# Patient Record
Sex: Female | Born: 1935 | Race: White | Hispanic: No | Marital: Single | Smoking: Former smoker
Health system: Southern US, Community
[De-identification: ages and names within clinical notes are randomized; demographics above are authoritative.]

## PROBLEM LIST (undated history)

## (undated) DIAGNOSIS — T8859XA Other complications of anesthesia, initial encounter: Secondary | ICD-10-CM

## (undated) DIAGNOSIS — R32 Unspecified urinary incontinence: Secondary | ICD-10-CM

## (undated) DIAGNOSIS — N390 Urinary tract infection, site not specified: Secondary | ICD-10-CM

## (undated) DIAGNOSIS — C4492 Squamous cell carcinoma of skin, unspecified: Secondary | ICD-10-CM

## (undated) DIAGNOSIS — C801 Malignant (primary) neoplasm, unspecified: Secondary | ICD-10-CM

## (undated) DIAGNOSIS — M1990 Unspecified osteoarthritis, unspecified site: Secondary | ICD-10-CM

## (undated) DIAGNOSIS — J189 Pneumonia, unspecified organism: Secondary | ICD-10-CM

## (undated) DIAGNOSIS — Z9221 Personal history of antineoplastic chemotherapy: Secondary | ICD-10-CM

## (undated) DIAGNOSIS — C50919 Malignant neoplasm of unspecified site of unspecified female breast: Secondary | ICD-10-CM

## (undated) DIAGNOSIS — T4145XA Adverse effect of unspecified anesthetic, initial encounter: Secondary | ICD-10-CM

## (undated) HISTORY — DX: Squamous cell carcinoma of skin, unspecified: C44.92

## (undated) HISTORY — DX: Malignant neoplasm of unspecified site of unspecified female breast: C50.919

## (undated) HISTORY — PX: MASTECTOMY: SHX3

## (undated) HISTORY — PX: ABDOMINAL HYSTERECTOMY: SHX81

## (undated) HISTORY — PX: TONSILLECTOMY: SUR1361

## (undated) HISTORY — DX: Unspecified urinary incontinence: R32

## (undated) HISTORY — PX: TRIGGER FINGER RELEASE: SHX641

## (undated) HISTORY — PX: CARPAL TUNNEL RELEASE: SHX101

## (undated) HISTORY — DX: Urinary tract infection, site not specified: N39.0

---

## 1997-08-25 ENCOUNTER — Other Ambulatory Visit: Admission: RE | Admit: 1997-08-25 | Discharge: 1997-08-25 | Payer: Self-pay | Admitting: Obstetrics and Gynecology

## 1997-11-08 ENCOUNTER — Other Ambulatory Visit: Admission: RE | Admit: 1997-11-08 | Discharge: 1997-11-08 | Payer: Self-pay | Admitting: Obstetrics and Gynecology

## 1997-11-29 ENCOUNTER — Ambulatory Visit (HOSPITAL_COMMUNITY): Admission: RE | Admit: 1997-11-29 | Discharge: 1997-11-29 | Payer: Self-pay | Admitting: Obstetrics and Gynecology

## 1998-03-14 ENCOUNTER — Other Ambulatory Visit: Admission: RE | Admit: 1998-03-14 | Discharge: 1998-03-14 | Payer: Self-pay | Admitting: Obstetrics and Gynecology

## 1998-07-21 ENCOUNTER — Other Ambulatory Visit: Admission: RE | Admit: 1998-07-21 | Discharge: 1998-07-21 | Payer: Self-pay | Admitting: Obstetrics and Gynecology

## 1998-08-30 ENCOUNTER — Other Ambulatory Visit: Admission: RE | Admit: 1998-08-30 | Discharge: 1998-08-30 | Payer: Self-pay | Admitting: Obstetrics and Gynecology

## 1999-03-07 ENCOUNTER — Other Ambulatory Visit: Admission: RE | Admit: 1999-03-07 | Discharge: 1999-03-07 | Payer: Self-pay | Admitting: Obstetrics and Gynecology

## 2000-03-06 ENCOUNTER — Other Ambulatory Visit: Admission: RE | Admit: 2000-03-06 | Discharge: 2000-03-06 | Payer: Self-pay | Admitting: Obstetrics and Gynecology

## 2001-03-20 ENCOUNTER — Other Ambulatory Visit: Admission: RE | Admit: 2001-03-20 | Discharge: 2001-03-20 | Payer: Self-pay | Admitting: Obstetrics and Gynecology

## 2002-03-30 ENCOUNTER — Other Ambulatory Visit: Admission: RE | Admit: 2002-03-30 | Discharge: 2002-03-30 | Payer: Self-pay | Admitting: Obstetrics and Gynecology

## 2002-07-01 HISTORY — PX: OTHER SURGICAL HISTORY: SHX169

## 2003-04-04 ENCOUNTER — Other Ambulatory Visit: Admission: RE | Admit: 2003-04-04 | Discharge: 2003-04-04 | Payer: Self-pay | Admitting: Obstetrics and Gynecology

## 2011-06-26 DIAGNOSIS — Z8542 Personal history of malignant neoplasm of other parts of uterus: Secondary | ICD-10-CM | POA: Insufficient documentation

## 2014-04-22 DIAGNOSIS — M67461 Ganglion, right knee: Secondary | ICD-10-CM | POA: Insufficient documentation

## 2014-04-22 DIAGNOSIS — N390 Urinary tract infection, site not specified: Secondary | ICD-10-CM | POA: Insufficient documentation

## 2014-04-22 DIAGNOSIS — G5600 Carpal tunnel syndrome, unspecified upper limb: Secondary | ICD-10-CM | POA: Insufficient documentation

## 2014-04-22 DIAGNOSIS — E785 Hyperlipidemia, unspecified: Secondary | ICD-10-CM | POA: Insufficient documentation

## 2014-04-22 DIAGNOSIS — M810 Age-related osteoporosis without current pathological fracture: Secondary | ICD-10-CM | POA: Insufficient documentation

## 2014-04-22 DIAGNOSIS — N952 Postmenopausal atrophic vaginitis: Secondary | ICD-10-CM | POA: Insufficient documentation

## 2014-04-22 DIAGNOSIS — R350 Frequency of micturition: Secondary | ICD-10-CM | POA: Insufficient documentation

## 2014-04-22 DIAGNOSIS — R300 Dysuria: Secondary | ICD-10-CM | POA: Insufficient documentation

## 2014-04-22 DIAGNOSIS — D500 Iron deficiency anemia secondary to blood loss (chronic): Secondary | ICD-10-CM | POA: Insufficient documentation

## 2014-04-22 DIAGNOSIS — M109 Gout, unspecified: Secondary | ICD-10-CM | POA: Insufficient documentation

## 2014-04-22 DIAGNOSIS — M7040 Prepatellar bursitis, unspecified knee: Secondary | ICD-10-CM | POA: Insufficient documentation

## 2014-04-22 DIAGNOSIS — E611 Iron deficiency: Secondary | ICD-10-CM | POA: Insufficient documentation

## 2014-04-22 DIAGNOSIS — E559 Vitamin D deficiency, unspecified: Secondary | ICD-10-CM | POA: Insufficient documentation

## 2014-04-22 DIAGNOSIS — R918 Other nonspecific abnormal finding of lung field: Secondary | ICD-10-CM | POA: Insufficient documentation

## 2014-11-29 DIAGNOSIS — M48061 Spinal stenosis, lumbar region without neurogenic claudication: Secondary | ICD-10-CM | POA: Insufficient documentation

## 2014-12-27 DIAGNOSIS — M5136 Other intervertebral disc degeneration, lumbar region: Secondary | ICD-10-CM | POA: Insufficient documentation

## 2014-12-27 DIAGNOSIS — M4726 Other spondylosis with radiculopathy, lumbar region: Secondary | ICD-10-CM | POA: Insufficient documentation

## 2015-04-11 DIAGNOSIS — Z09 Encounter for follow-up examination after completed treatment for conditions other than malignant neoplasm: Secondary | ICD-10-CM | POA: Insufficient documentation

## 2016-03-01 DIAGNOSIS — E785 Hyperlipidemia, unspecified: Secondary | ICD-10-CM | POA: Insufficient documentation

## 2018-05-22 DIAGNOSIS — M25551 Pain in right hip: Secondary | ICD-10-CM | POA: Insufficient documentation

## 2018-08-19 ENCOUNTER — Encounter (HOSPITAL_COMMUNITY): Payer: Self-pay | Admitting: Student

## 2018-08-19 NOTE — H&P (Signed)
TOTAL HIP ADMISSION H&P  Patient is admitted for right total hip arthroplasty.  Subjective:  Chief Complaint: right hip pain  HPI: Heather Rhodes, 85 y.o. female, has a history of pain and functional disability in the right hip(s) due to arthritis and patient has failed non-surgical conservative treatments for greater than 12 weeks to include corticosteriod injections and activity modification.  Onset of symptoms was abrupt starting 1 years ago with gradually worsening course since that time.The patient noted no past surgery on the right hip(s).  Patient currently rates pain in the right hip at 8 out of 10 with activity. Patient has worsening of pain with activity and weight bearing and instability. 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 This condition presents safety issues increasing the risk of falls. There is no current active infection.  There are no active problems to display for this patient.  History reviewed. No pertinent past medical history.  History reviewed. No pertinent surgical history.  No current facility-administered medications for this encounter.    No current outpatient medications on file.   Allergies not on file  Social History   Tobacco Use  . Smoking status: Not on file  Substance Use Topics  . Alcohol use: Not on file    History reviewed. No pertinent family history.   Review of Systems  Constitutional: Negative for chills and fever.  HENT: Negative for congestion, sore throat and tinnitus.   Eyes: Negative for double vision, photophobia and pain.  Respiratory: Negative for cough, shortness of breath and wheezing.   Cardiovascular: Negative for chest pain, palpitations and orthopnea.  Gastrointestinal: Negative for heartburn, nausea and vomiting.  Genitourinary: Negative for dysuria, frequency and urgency.  Musculoskeletal: Positive for joint pain.  Neurological: Negative for dizziness, weakness  and headaches.    Objective:  Physical Exam  Well nourished and well developed.  General: Alert and oriented x3, cooperative and pleasant, no acute distress.  Head: normocephalic, atraumatic, neck supple.  Eyes: EOMI.  Respiratory: breath sounds clear in all fields, no wheezing, rales, or rhonchi. Cardiovascular: Regular rate and rhythm, no murmurs, gallops or rubs.  Abdomen: non-tender to palpation and soft, normoactive bowel sounds. Musculoskeletal:  Right Hip Exam: ROM: Flexion to 100, Internal Rotation 0, External Rotation 10-20, and Abduction 20 with pain and discomfort. There is no tenderness over the greater trochanter bursa.   Calves soft and nontender. Motor function intact in LE. Strength 5/5 LE bilaterally. Neuro: Distal pulses 2+. Sensation to light touch intact in LE.  Vital signs in last 24 hours: Blood pressure: 122/72 mmHg Pulse: 76 bpm  Imaging Review Plain radiographs demonstrate severe degenerative joint disease of the right hip(s). The bone quality appears to be adequate for age and reported activity level.   Assessment/Plan:  End stage arthritis, right hip(s)  The patient history, physical examination, clinical judgement of the provider and imaging studies are consistent with end stage degenerative joint disease of the right hip(s) and total hip arthroplasty is deemed medically necessary. The treatment options including medical management, injection therapy, arthroscopy and arthroplasty were discussed at length. The risks and benefits of total hip arthroplasty were presented and reviewed. The risks due to aseptic loosening, infection, stiffness, dislocation/subluxation,  thromboembolic complications and other imponderables were discussed.  The patient acknowledged the explanation, agreed to proceed with the plan and consent was signed. Patient is being admitted for inpatient treatment for surgery, pain control, PT, OT, prophylactic antibiotics, VTE prophylaxis,  progressive ambulation and  ADL's and discharge planning.The patient is planning to be discharged home.  Therapy Plans: HHPT Disposition: Home to retirement community Planned DVT Prophylaxis: Xarelto 10 mg daily (hx breast and endometrial CA) DME needed: None PCP: Dagoberto Reef, MD TXA: IV Allergies: NKDA Anesthesia Concerns: None BMI: 23 Other: NO BP OR IV STICKS IN LEFT ARM  - Patient was instructed on what medications to stop prior to surgery. - Follow-up visit in 2 weeks with Dr. Wynelle Link - Begin physical therapy following surgery - Pre-operative lab work as pre-surgical testing - Prescriptions will be provided in hospital at time of discharge  Theresa Duty, PA-C Orthopedic Surgery [facility] [place]

## 2018-08-25 ENCOUNTER — Encounter (HOSPITAL_COMMUNITY): Payer: Self-pay

## 2018-08-25 NOTE — Patient Instructions (Signed)
Heather Rhodes  08/25/2018   Your procedure is scheduled on: 09-02-18   Report to [facility] Main  Entrance              Report to admitting at      0600 AM    Call this number if you have problems the morning of surgery [phone]    Remember: Do not eat food or drink liquids :After Midnight.  BRUSH YOUR TEETH MORNING OF SURGERY AND RINSE YOUR MOUTH OUT, NO CHEWING GUM CANDY OR MINTS.     Take these medicines the morning of surgery with A SIP OF WATER: None                                You may not have any metal on your body including hair pins and              piercings  Do not wear jewelry, make-up, lotions, powders or perfumes, deodorant             Do not wear nail polish.  Do not shave  48 hours prior to surgery.     Do not bring valuables to the hospital. [place].  Contacts, dentures or bridgework may not be worn into surgery.  Leave suitcase in the car. After surgery it may be brought to your room.                Please read over the following fact sheets you were given: _____________________________________________________________________             [facility] - Preparing for Surgery Before surgery, you can play an important role.  Because skin is not sterile, your skin needs to be as free of germs as possible.  You can reduce the number of germs on your skin by washing with CHG (chlorahexidine gluconate) soap before surgery.  CHG is an antiseptic cleaner which kills germs and bonds with the skin to continue killing germs even after washing. Please DO NOT use if you have an allergy to CHG or antibacterial soaps.  If your skin becomes reddened/irritated stop using the CHG and inform your nurse when you arrive at Short Stay. Do not shave (including legs and underarms) for at least 48 hours prior to the first CHG shower.  You may shave your face/neck. Please follow these instructions  carefully:  1.  Shower with CHG Soap the night before surgery and the  morning of Surgery.  2.  If you choose to wash your hair, wash your hair first as usual with your  normal  shampoo.  3.  After you shampoo, rinse your hair and body thoroughly to remove the  shampoo.                           4.  Use CHG as you would any other liquid soap.  You can apply chg directly  to the skin and wash                       Gently with a scrungie or clean washcloth.  5.  Apply the CHG Soap to your body ONLY FROM THE NECK  DOWN.   Do not use on face/ open                           Wound or open sores. Avoid contact with eyes, ears mouth and genitals (private parts).                       Wash face,  Genitals (private parts) with your normal soap.             6.  Wash thoroughly, paying special attention to the area where your surgery  will be performed.  7.  Thoroughly rinse your body with warm water from the neck down.  8.  DO NOT shower/wash with your normal soap after using and rinsing off  the CHG Soap.                9.  Pat yourself dry with a clean towel.            10.  Wear clean pajamas.            11.  Place clean sheets on your bed the night of your first shower and do not  sleep with pets. Day of Surgery : Do not apply any lotions/deodorants the morning of surgery.  Please wear clean clothes to the hospital/surgery center.  FAILURE TO FOLLOW THESE INSTRUCTIONS MAY RESULT IN THE CANCELLATION OF YOUR SURGERY PATIENT SIGNATURE_________________________________  NURSE SIGNATURE__________________________________  ________________________________________________________________________  WHAT IS A BLOOD TRANSFUSION? Blood Transfusion Information  A transfusion is the replacement of blood or some of its parts. Blood is made up of multiple cells which provide different functions.  Red blood cells carry oxygen and are used for blood loss replacement.  White blood cells fight against  infection.  Platelets control bleeding.  Plasma helps clot blood.  Other blood products are available for specialized needs, such as hemophilia or other clotting disorders. BEFORE THE TRANSFUSION  Who gives blood for transfusions?   Healthy volunteers who are fully evaluated to make sure their blood is safe. This is blood bank blood. Transfusion therapy is the safest it has ever been in the practice of medicine. Before blood is taken from a donor, a complete history is taken to make sure that person has no history of diseases nor engages in risky social behavior (examples are intravenous drug use or sexual activity with multiple partners). The donor's travel history is screened to minimize risk of transmitting infections, such as malaria. The donated blood is tested for signs of infectious diseases, such as HIV and hepatitis. The blood is then tested to be sure it is compatible with you in order to minimize the chance of a transfusion reaction. If you or a relative donates blood, this is often done in anticipation of surgery and is not appropriate for emergency situations. It takes many days to process the donated blood. RISKS AND COMPLICATIONS Although transfusion therapy is very safe and saves many lives, the main dangers of transfusion include:   Getting an infectious disease.  Developing a transfusion reaction. This is an allergic reaction to something in the blood you were given. Every precaution is taken to prevent this. The decision to have a blood transfusion has been considered carefully by your caregiver before blood is given. Blood is not given unless the benefits outweigh the risks. AFTER THE TRANSFUSION  Right after receiving a blood transfusion, you will usually feel much better and more energetic.  This is especially true if your red blood cells have gotten low (anemic). The transfusion raises the level of the red blood cells which carry oxygen, and this usually causes an energy  increase.  The nurse administering the transfusion will monitor you carefully for complications. HOME CARE INSTRUCTIONS  No special instructions are needed after a transfusion. You may find your energy is better. Speak with your caregiver about any limitations on activity for underlying diseases you may have. SEEK MEDICAL CARE IF:   Your condition is not improving after your transfusion.  You develop redness or irritation at the intravenous (IV) site. SEEK IMMEDIATE MEDICAL CARE IF:  Any of the following symptoms occur over the next 12 hours:  Shaking chills.  You have a temperature by mouth above 102 F (38.9 C), not controlled by medicine.  Chest, back, or muscle pain.  People around you feel you are not acting correctly or are confused.  Shortness of breath or difficulty breathing.  Dizziness and fainting.  You get a rash or develop hives.  You have a decrease in urine output.  Your urine turns a dark color or changes to pink, red, or brown. Any of the following symptoms occur over the next 10 days:  You have a temperature by mouth above 102 F (38.9 C), not controlled by medicine.  Shortness of breath.  Weakness after normal activity.  The white part of the eye turns yellow (jaundice).  You have a decrease in the amount of urine or are urinating less often.  Your urine turns a dark color or changes to pink, red, or brown. Document Released: 06/14/2000 Document Revised: 09/09/2011 Document Reviewed: 02/01/2008 Exit[employer] Patient Information 2014 [place].  _______________________________________________________________________  Incentive Spirometer  An incentive spirometer is a tool that can help keep your lungs clear and active. This tool measures how well you are filling your lungs with each breath. Taking long deep breaths may help reverse or decrease the chance of developing breathing (pulmonary) problems (especially infection) following:  A long  period of time when you are unable to move or be active. BEFORE THE PROCEDURE   If the spirometer includes an indicator to show your best effort, your nurse or respiratory therapist will set it to a desired goal.  If possible, sit up straight or lean slightly forward. Try not to slouch.  Hold the incentive spirometer in an upright position. INSTRUCTIONS FOR USE  1. Sit on the edge of your bed if possible, or sit up as far as you can in bed or on a chair. 2. Hold the incentive spirometer in an upright position. 3. Breathe out normally. 4. Place the mouthpiece in your mouth and seal your lips tightly around it. 5. Breathe in slowly and as deeply as possible, raising the piston or the ball toward the top of the column. 6. Hold your breath for 3-5 seconds or for as long as possible. Allow the piston or ball to fall to the bottom of the column. 7. Remove the mouthpiece from your mouth and breathe out normally. 8. Rest for a few seconds and repeat Steps 1 through 7 at least 10 times every 1-2 hours when you are awake. Take your time and take a few normal breaths between deep breaths. 9. The spirometer may include an indicator to show your best effort. Use the indicator as a goal to work toward during each repetition. 10. After each set of 10 deep breaths, practice coughing to be sure your lungs are clear.  If you have an incision (the cut made at the time of surgery), support your incision when coughing by placing a pillow or rolled up towels firmly against it. Once you are able to get out of bed, walk around indoors and cough well. You may stop using the incentive spirometer when instructed by your caregiver.  RISKS AND COMPLICATIONS  Take your time so you do not get dizzy or light-headed.  If you are in pain, you may need to take or ask for pain medication before doing incentive spirometry. It is harder to take a deep breath if you are having pain. AFTER USE  Rest and breathe slowly and  easily.  It can be helpful to keep track of a log of your progress. Your caregiver can provide you with a simple table to help with this. If you are using the spirometer at home, follow these instructions: [place] IF:   You are having difficultly using the spirometer.  You have trouble using the spirometer as often as instructed.  Your pain medication is not giving enough relief while using the spirometer.  You develop fever of 100.5 F (38.1 C) or higher. SEEK IMMEDIATE MEDICAL CARE IF:   You cough up bloody sputum that had not been present before.  You develop fever of 102 F (38.9 C) or greater.  You develop worsening pain at or near the incision site. MAKE SURE YOU:   Understand these instructions.  Will watch your condition.  Will get help right away if you are not doing well or get worse. Document Released: 10/28/2006 Document Revised: 09/09/2011 Document Reviewed: 12/29/2006 [facility] Patient Information 2014 [place], [place].   ________________________________________________________________________

## 2018-08-26 ENCOUNTER — Encounter (HOSPITAL_COMMUNITY): Payer: Self-pay | Admitting: Physician Assistant

## 2018-08-26 ENCOUNTER — Encounter (HOSPITAL_COMMUNITY)
Admission: RE | Admit: 2018-08-26 | Discharge: 2018-08-26 | Disposition: A | Discharge status: Home / Self Care | Payer: Medicare HMO | Source: Ambulatory Visit | Attending: Orthopedic Surgery | Admitting: Orthopedic Surgery

## 2018-08-26 ENCOUNTER — Other Ambulatory Visit: Payer: Self-pay

## 2018-08-26 ENCOUNTER — Encounter (HOSPITAL_COMMUNITY): Payer: Self-pay

## 2018-08-26 DIAGNOSIS — Z01812 Encounter for preprocedural laboratory examination: Secondary | ICD-10-CM | POA: Insufficient documentation

## 2018-08-26 HISTORY — DX: Pneumonia, unspecified organism: J18.9

## 2018-08-26 HISTORY — DX: Malignant (primary) neoplasm, unspecified: C80.1

## 2018-08-26 HISTORY — DX: Adverse effect of unspecified anesthetic, initial encounter: T41.45XA

## 2018-08-26 HISTORY — DX: Unspecified osteoarthritis, unspecified site: M19.90

## 2018-08-26 HISTORY — DX: Personal history of antineoplastic chemotherapy: Z92.21

## 2018-08-26 HISTORY — DX: Other complications of anesthesia, initial encounter: T88.59XA

## 2018-08-26 LAB — CBC
HCT: 42.5 % (ref 36.0–46.0)
Hemoglobin: 13.3 g/dL (ref 12.0–15.0)
MCH: 31.3 pg (ref 26.0–34.0)
MCHC: 31.3 g/dL (ref 30.0–36.0)
MCV: 100 fL (ref 80.0–100.0)
NRBC: 0 % (ref 0.0–0.2)
Platelets: 291 10*3/uL (ref 150–400)
RBC: 4.25 MIL/uL (ref 3.87–5.11)
RDW: 12.2 % (ref 11.5–15.5)
WBC: 8 10*3/uL (ref 4.0–10.5)

## 2018-08-26 LAB — COMPREHENSIVE METABOLIC PANEL
ALT: 18 U/L (ref 0–44)
AST: 23 U/L (ref 15–41)
Albumin: 4.2 g/dL (ref 3.5–5.0)
Alkaline Phosphatase: 62 U/L (ref 38–126)
Anion gap: 8 (ref 5–15)
BUN: 24 mg/dL — ABNORMAL HIGH (ref 8–23)
CO2: 25 mmol/L (ref 22–32)
Calcium: 9.4 mg/dL (ref 8.9–10.3)
Chloride: 106 mmol/L (ref 98–111)
Creatinine, Ser: 0.72 mg/dL (ref 0.44–1.00)
GFR calc Af Amer: >60 mL/min (ref >60)
GFR calc non Af Amer: >60 mL/min (ref >60)
GLUCOSE: 96 mg/dL (ref 70–99)
Potassium: 4.1 mmol/L (ref 3.5–5.1)
Sodium: 139 mmol/L (ref 135–145)
Total Bilirubin: 0.6 mg/dL (ref 0.3–1.2)
Total Protein: 7.1 g/dL (ref 6.5–8.1)

## 2018-08-26 LAB — ABO/RH: ABO/RH(D): A POS

## 2018-08-26 LAB — PROTIME-INR
INR: 0.9 (ref 0.8–1.2)
Prothrombin Time: 12 seconds (ref 11.4–15.2)

## 2018-08-26 LAB — SURGICAL PCR SCREEN
MRSA, PCR: NEGATIVE
Staphylococcus aureus: NEGATIVE

## 2018-08-26 LAB — APTT: aPTT: 38 seconds — ABNORMAL HIGH (ref 24–36)

## 2018-08-27 NOTE — Progress Notes (Signed)
PCP:  Adline Mango  CARDIOLOGIST: None  INFO IN Epic:bun [id number], ptt [id number]  INFO ON CHART: EKG on chart  BLOOD THINNERS AND LAST DOSES:n/a ____________________________________  PATIENT SYMPTOMS AT TIME OF PREOP: none

## 2018-08-27 NOTE — Progress Notes (Signed)
ekg 08-07-18 on chart

## 2018-09-02 LAB — TYPE AND SCREEN
ABO/RH(D): A POS
Antibody Screen: NEGATIVE

## 2018-10-14 ENCOUNTER — Encounter (HOSPITAL_COMMUNITY): Admission: RE | Payer: Self-pay | Source: Home / Self Care

## 2018-10-14 ENCOUNTER — Inpatient Hospital Stay (HOSPITAL_COMMUNITY): Admission: RE | Admit: 2018-10-14 | Payer: Medicare HMO | Source: Home / Self Care | Admitting: Orthopedic Surgery

## 2018-10-14 SURGERY — ARTHROPLASTY, HIP, TOTAL, ANTERIOR APPROACH
Anesthesia: Choice | Laterality: Right

## 2018-12-02 NOTE — Patient Instructions (Addendum)
Heather Rhodes [facility]   Your procedure is scheduled on: Wednesday 12/09/2018    Report to [facility] Main  Entrance Report to admitting at  1100  AM                 Heather Rhodes 21 TEST ON_Monday 6/8______ @__11 :05_____, THIS TEST MUST BE DONE BEFORE SURGERY, COME TO [name].    Call this number if you have problems the morning of surgery [phone]     Remember: Do not eat food  :After Midnight.              NO SOLID FOOD AFTER MIDNIGHT THE NIGHT PRIOR TO SURGERY           . NOTHING BY MOUTH EXCEPT CLEAR LIQUIDS UNTIL  0730 am.               PLEASE FINISH ENSURE DRINK PER SURGEON ORDER  WHICH NEEDS TO BE COMPLETED AT   0430 am.   CLEAR LIQUID DIET   Foods Allowed                                                                     Foods Excluded  Coffee and tea, regular and decaf                             liquids that you cannot  Plain Jell-O in any flavor                                             see through such as: Fruit ices (not with fruit pulp)                                     milk, soups, orange juice  Iced Popsicles                                    All solid food Carbonated beverages, regular and diet                                    Cranberry, grape and apple juices Sports drinks like Gatorade Lightly seasoned clear broth or consume(fat free) Sugar, honey syrup                                      BRUSH YOUR TEETH MORNING OF SURGERY AND RINSE YOUR MOUTH OUT           , NO CHEWING GUM CANDY OR MINTS.     Take these medicines the morning of surgery with A SIP OF WATER: none  You may not have any metal on your body including hair pins and              piercings               Do not wear jewelry, make-up, lotions, powders or perfumes, deodorant             Do not wear nail polish.               Do not shave  48 hours prior to surgery.             Do not bring valuables to the hospital. Heather Rhodes.  Contacts, dentures or bridgework may not be worn into surgery.                   Please read over the following fact sheets you were given: _____________________________________________________________________             Heather Rhodes - Preparing for Surgery  Before surgery, you can play an important role.  Because skin is not sterile, your skin needs to be as free of germs as possible.   You can reduce the number of germs on your skin by washing with CHG (chlorahexidine gluconate) soap before surgery.   CHG is an antiseptic cleaner which kills germs and bonds with the skin to continue killing germs even after washing. Please DO NOT use if you have an allergy to CHG or antibacterial soaps.  If your skin becomes reddened/irritated stop using the CHG and inform your nurse when you arrive at Short Stay. Do not shave (including legs and underarms) for at least 48 hours prior to the first CHG shower.  You may shave your face/neck.  Please follow these instructions carefully :  1.  Shower with CHG Soap the night before surgery and the  morning of Surgery.  2.  If you choose to wash your hair, wash your hair first as usual with your  normal  shampoo.  3.  After you shampoo, rinse your hair and body thoroughly to remove the  shampoo.                                        4.  Use CHG as you would any other liquid soap.  You can apply chg directly  to the skin and wash                       Gently with a scrungie or clean washcloth.  5.  Apply the CHG Soap to your body ONLY FROM THE NECK DOWN.   Do not use on face/ open                           Wound or open sores. Avoid contact with eyes, ears mouth and genitals (private parts).                       Wash face,  Genitals (private parts) with your normal soap.             6.  Wash thoroughly, paying special attention to the area where your  surgery  will be performed.  7.  Thoroughly rinse  your body with warm water from the neck down.  8.  DO NOT shower/wash with your normal soap after using and rinsing off  the CHG Soap.             9.  Pat yourself dry with a clean towel.            10.  Wear clean pajamas.            11.  Place clean sheets on your bed the night of your first shower and do not  sleep with pets . Day of Surgery : Do not apply any lotions/deodorants the morning of surgery.  Please wear clean clothes to the hospital/surgery center.  FAILURE TO FOLLOW THESE INSTRUCTIONS MAY RESULT IN THE CANCELLATION OF YOUR SURGERY PATIENT SIGNATURE_________________________________  NURSE SIGNATURE__________________________________  ________________________________________________________________________   Heather Rhodes  An incentive spirometer is a tool that can help keep your lungs clear and active. This tool measures how well you are filling your lungs with each breath. Taking long deep breaths may help reverse or decrease the chance of developing breathing (pulmonary) problems (especially infection) following:  A long period of time when you are unable to move or be active. BEFORE THE PROCEDURE   If the spirometer includes an indicator to show your best effort, your nurse or respiratory therapist will set it to a desired goal.  If possible, sit up straight or lean slightly forward. Try not to slouch.  Hold the incentive spirometer in an upright position. INSTRUCTIONS FOR USE  1. Sit on the edge of your bed if possible, or sit up as far as you can in bed or on a chair. 2. Hold the incentive spirometer in an upright position. 3. Breathe out normally. 4. Place the mouthpiece in your mouth and seal your lips tightly around it. 5. Breathe in slowly and as deeply as possible, raising the piston or the ball toward the top of the column. 6. Hold your breath for 3-5 seconds or for as long as possible. Allow the  piston or ball to fall to the bottom of the column. 7. Remove the mouthpiece from your mouth and breathe out normally. 8. Rest for a few seconds and repeat Steps 1 through 7 at least 10 times every 1-2 hours when you are awake. Take your time and take a few normal breaths between deep breaths. 9. The spirometer may include an indicator to show your best effort. Use the indicator as a goal to work toward during each repetition. 10. After each set of 10 deep breaths, practice coughing to be sure your lungs are clear. If you have an incision (the cut made at the time of surgery), support your incision when coughing by placing a pillow or rolled up towels firmly against it. Once you are able to get out of bed, walk around indoors and cough well. You may stop using the incentive spirometer when instructed by your caregiver.  RISKS AND COMPLICATIONS  Take your time so you do not get dizzy or light-headed.  If you are in pain, you may need to take or ask for pain medication before doing incentive spirometry. It is harder to take a deep breath if you are having pain. AFTER USE  Rest and breathe slowly and easily.  It can be helpful to keep track of a log of your progress. Your caregiver can provide you with a simple table to help with this. If you are using the spirometer at home, follow these  instructions: SEEK MEDICAL CARE IF:   You are having difficultly using the spirometer.  You have trouble using the spirometer as often as instructed.  Your pain medication is not giving enough relief while using the spirometer.  You develop fever of 100.5 F (38.1 C) or higher. SEEK IMMEDIATE MEDICAL CARE IF:   You cough up bloody sputum that had not been present before.  You develop fever of 102 F (38.9 C) or greater.  You develop worsening pain at or near the incision site. MAKE SURE YOU:   Understand these instructions.  Will watch your condition.  Will get help right away if you are not  doing well or get worse. Document Released: 10/28/2006 Document Revised: 09/09/2011 Document Reviewed: 12/29/2006 ExitCare Patient Information 2014 Exit[employer], [place].   ________________________________________________________________________  WHAT IS A BLOOD TRANSFUSION? Blood Transfusion Information  A transfusion is the replacement of blood or some of its parts. Blood is made up of multiple cells which provide different functions.  Red blood cells carry oxygen and are used for blood loss replacement.  White blood cells fight against infection.  Platelets control bleeding.  Plasma helps clot blood.  Other blood products are available for specialized needs, such as hemophilia or other clotting disorders. BEFORE THE TRANSFUSION  Who gives blood for transfusions?   Healthy volunteers who are fully evaluated to make sure their blood is safe. This is blood bank blood. Transfusion therapy is the safest it has ever been in the practice of medicine. Before blood is taken from a donor, a complete history is taken to make sure that person has no history of diseases nor engages in risky social behavior (examples are intravenous drug use or sexual activity with multiple partners). The donor's travel history is screened to minimize risk of transmitting infections, such as malaria. The donated blood is tested for signs of infectious diseases, such as HIV and hepatitis. The blood is then tested to be sure it is compatible with you in order to minimize the chance of a transfusion reaction. If you or a relative donates blood, this is often done in anticipation of surgery and is not appropriate for emergency situations. It takes many days to process the donated blood. RISKS AND COMPLICATIONS Although transfusion therapy is very safe and saves many lives, the main dangers of transfusion include:   Getting an infectious disease.  Developing a transfusion reaction. This is an allergic reaction to something  in the blood you were given. Every precaution is taken to prevent this. The decision to have a blood transfusion has been considered carefully by your caregiver before blood is given. Blood is not given unless the benefits outweigh the risks. AFTER THE TRANSFUSION  Right after receiving a blood transfusion, you will usually feel much better and more energetic. This is especially true if your red blood cells have gotten low (anemic). The transfusion raises the level of the red blood cells which carry oxygen, and this usually causes an energy increase.  The nurse administering the transfusion will monitor you carefully for complications. HOME CARE INSTRUCTIONS  No special instructions are needed after a transfusion. You may find your energy is better. Speak with your caregiver about any limitations on activity for underlying diseases you may have. SEEK MEDICAL CARE IF:   Your condition is not improving after your transfusion.  You develop redness or irritation at the intravenous (IV) site. SEEK IMMEDIATE MEDICAL CARE IF:  Any of the following symptoms occur over the next 12 hours:  Shaking chills.  You have a temperature by mouth above 102 F (38.9 C), not controlled by medicine.  Chest, back, or muscle pain.  People around you feel you are not acting correctly or are confused.  Shortness of breath or difficulty breathing.  Dizziness and fainting.  You get a rash or develop hives.  You have a decrease in urine output.  Your urine turns a dark color or changes to pink, red, or brown. Any of the following symptoms occur over the next 10 days:  You have a temperature by mouth above 102 F (38.9 C), not controlled by medicine.  Shortness of breath.  Weakness after normal activity.  The white part of the eye turns yellow (jaundice).  You have a decrease in the amount of urine or are urinating less often.  Your urine turns a dark color or changes to pink, red, or  brown. Document Released: 06/14/2000 Document Revised: 09/09/2011 Document Reviewed: 02/01/2008 [facility] Patient Information 2014 [place], [place].  _______________________________________________________________________

## 2018-12-02 NOTE — Progress Notes (Signed)
08/07/2018-noted in Epic-Clearance by Dr. Fay Records

## 2018-12-03 ENCOUNTER — Encounter (HOSPITAL_COMMUNITY)
Admission: RE | Admit: 2018-12-03 | Discharge: 2018-12-03 | Disposition: A | Discharge status: Home / Self Care | Payer: Medicare HMO | Source: Ambulatory Visit | Attending: Orthopedic Surgery | Admitting: Orthopedic Surgery

## 2018-12-03 ENCOUNTER — Encounter (HOSPITAL_COMMUNITY): Payer: Self-pay

## 2018-12-03 ENCOUNTER — Other Ambulatory Visit: Payer: Self-pay

## 2018-12-03 DIAGNOSIS — Z01818 Encounter for other preprocedural examination: Secondary | ICD-10-CM | POA: Insufficient documentation

## 2018-12-03 LAB — COMPREHENSIVE METABOLIC PANEL
ALT: 18 U/L (ref 0–44)
AST: 23 U/L (ref 15–41)
Albumin: 4.3 g/dL (ref 3.5–5.0)
Alkaline Phosphatase: 63 U/L (ref 38–126)
Anion gap: 6 (ref 5–15)
BUN: 26 mg/dL — ABNORMAL HIGH (ref 8–23)
CO2: 28 mmol/L (ref 22–32)
Calcium: 9.5 mg/dL (ref 8.9–10.3)
Chloride: 106 mmol/L (ref 98–111)
Creatinine, Ser: 0.69 mg/dL (ref 0.44–1.00)
GFR calc Af Amer: >60 mL/min (ref >60)
GFR calc non Af Amer: >60 mL/min (ref >60)
Glucose, Bld: 112 mg/dL — ABNORMAL HIGH (ref 70–99)
Potassium: 4 mmol/L (ref 3.5–5.1)
Sodium: 140 mmol/L (ref 135–145)
Total Bilirubin: 0.5 mg/dL (ref 0.3–1.2)
Total Protein: 7.4 g/dL (ref 6.5–8.1)

## 2018-12-03 LAB — CBC
HCT: 41 % (ref 36.0–46.0)
Hemoglobin: 13.2 g/dL (ref 12.0–15.0)
MCH: 31.7 pg (ref 26.0–34.0)
MCHC: 32.2 g/dL (ref 30.0–36.0)
MCV: 98.3 fL (ref 80.0–100.0)
Platelets: 290 10*3/uL (ref 150–400)
RBC: 4.17 MIL/uL (ref 3.87–5.11)
RDW: 12.6 % (ref 11.5–15.5)
WBC: 7.9 10*3/uL (ref 4.0–10.5)
nRBC: 0 % (ref 0.0–0.2)

## 2018-12-03 LAB — PROTIME-INR
INR: 0.9 (ref 0.8–1.2)
Prothrombin Time: 12.1 seconds (ref 11.4–15.2)

## 2018-12-03 LAB — SURGICAL PCR SCREEN
MRSA, PCR: NEGATIVE
Staphylococcus aureus: NEGATIVE

## 2018-12-03 LAB — APTT: aPTT: 38 seconds — ABNORMAL HIGH (ref 24–36)

## 2018-12-04 NOTE — H&P (Signed)
TOTAL HIP ADMISSION H&P  Patient is admitted for right total hip arthroplasty.  Subjective:  Chief Complaint: right hip pain  HPI: Heather Rhodes, 85 y.o. female, has a history of pain and functional disability in the right hip(s) due to arthritis and patient has failed non-surgical conservative treatments for greater than 12 weeks to include NSAID's and/or analgesics and corticosteriod injections.  Onset of symptoms was gradual starting 2 years ago with gradually worsening course since that time.The patient noted no past surgery on the right hip(s).  Patient currently rates pain in the right hip at 8 out of 10 with activity. Patient has worsening of pain with activity and weight bearing, pain that interfers with activities of daily living and instability. Patient has evidence of severe, bone-on-bone end-stage right hip arthritis with slight protrusio deformity and osteophyte formation by imaging studies. This condition presents safety issues increasing the risk of falls. There is no current active infection.  There are no active problems to display for this patient.  Past Medical History:  Diagnosis Date   Arthritis    Cancer [facility]    breast left and endometrial cancer   Complication of anesthesia    during hysterectomy 2012 had to wake up took 3 hours and BP was low   Personal history of chemotherapy    with endometrial cancer   Pneumonia     Past Surgical History:  Procedure Laterality Date   ABDOMINAL HYSTERECTOMY     CARPAL TUNNEL RELEASE     MASTECTOMY     left  2006   TRIGGER FINGER RELEASE      No current facility-administered medications for this encounter.    Current Outpatient Medications  Medication Sig Dispense Refill Last Dose   ALFALFA PO Take 10 tablets by mouth daily.       Homeopathic Products (ARNICARE) GEL Apply 1 application topically daily as needed (pain).      ibuprofen (ADVIL,MOTRIN) 200 MG tablet Take 400 mg by mouth every 6 (six) hours as  needed for moderate pain.      OVER THE COUNTER MEDICATION Take 6 tablets by mouth daily. Vitamin 6 pack      Polyethyl Glycol-Propyl Glycol (SYSTANE OP) Place 1 drop into both eyes 3 (three) times daily.      No Known Allergies  Social History   Tobacco Use   Smoking status: Former Smoker    Years: 30.00    Types: Cigarettes   Smokeless tobacco: Never Used   Tobacco comment: quit 1968  social smoker  Substance Use Topics   Alcohol use: Yes    Comment: rarely    No family history on file.   ROS Constitutional: Constitutional: no fever, chills, night sweats, or significant weight loss. Cardiovascular: Cardiovascular: no palpitations or chest pain. Respiratory: Respiratory: no cough or shortness of breath and No COPD. Gastrointestinal: Gastrointestinal: no vomiting or nausea. Musculoskeletal: Musculoskeletal: no swelling in Joints and Joint Pain. Neurologic: Neurologic: no numbness, tingling, or difficulty with balance.  Objective:  Physical Exam Patient is an 85 year old female.  Well nourished and well developed. General: Alert and oriented x3, cooperative and pleasant, no acute distress. Head: normocephalic, atraumatic, neck supple. Eyes: EOMI. Respiratory: breath sounds clear in all fields, no wheezing, rales, or rhonchi. Cardiovascular: Regular rate and rhythm, no murmurs, gallops or rubs. Abdomen: non-tender to palpation and soft, normoactive bowel sounds.  Musculoskeletal: Right Hip Exam: ROM: Flexion to 100, Internal Rotation 0, External Rotation 10-20, and Abduction 20 with pain and discomfort. There  is no tenderness over the greater trochanter bursa.  Calves soft and nontender. Motor function intact in LE. Strength 5/5 LE bilaterally. Neuro: Distal pulses 2+. Sensation to light touch intact in LE.  Vital signs in last 24 hours:  BP: 110/65 mmHg  Labs:   Estimated body mass index is 22.65 kg/m as calculated from the following:   Height as of 12/03/18:  5' (1.524 m).   Weight as of 12/03/18: 52.6 kg.   Imaging Review Plain radiographs demonstrate severe degenerative joint disease of the right hip(s). The bone quality appears to be adequate for age and reported activity level.      Assessment/Plan:  End stage arthritis, right hip(s)  The patient history, physical examination, clinical judgement of the provider and imaging studies are consistent with end stage degenerative joint disease of the right hip(s) and total hip arthroplasty is deemed medically necessary. The treatment options including medical management, injection therapy, arthroscopy and arthroplasty were discussed at length. The risks and benefits of total hip arthroplasty were presented and reviewed. The risks due to aseptic loosening, infection, stiffness, dislocation/subluxation,  thromboembolic complications and other imponderables were discussed.  The patient acknowledged the explanation, agreed to proceed with the plan and consent was signed. Patient is being admitted for inpatient treatment for surgery, pain control, PT, OT, prophylactic antibiotics, VTE prophylaxis, progressive ambulation and ADL's and discharge planning.The patient is planning to be discharged home   Therapy Plans: HHPT Disposition: Home to retirement community Planned DVT Prophylaxis: Xarelto 10 mg daily (hx breast and endometrial CA) DME needed: None PCP: Dagoberto Reef, MD TXA: IV Allergies: Aleve - wheezing Anesthesia Concerns: Low blood pressure concerns with anesthesia BMI: 23 Other: NO BP OR IV STICKS IN LEFT ARM  - Patient was instructed on what medications to stop prior to surgery. - Follow-up visit in 2 weeks with Dr. Wynelle Link - Begin physical therapy following surgery - Pre-operative lab work as pre-surgical testing - Prescriptions will be provided in hospital at time of discharge  Griffith Citron, PA-C Orthopedic Surgery [facility] [place]

## 2018-12-07 ENCOUNTER — Other Ambulatory Visit: Payer: Self-pay

## 2018-12-07 ENCOUNTER — Other Ambulatory Visit (HOSPITAL_COMMUNITY)
Admission: RE | Admit: 2018-12-07 | Discharge: 2018-12-07 | Disposition: A | Discharge status: Home / Self Care | Payer: Medicare HMO | Source: Ambulatory Visit | Attending: Orthopedic Surgery | Admitting: Orthopedic Surgery

## 2018-12-07 DIAGNOSIS — Z1159 Encounter for screening for other viral diseases: Secondary | ICD-10-CM | POA: Insufficient documentation

## 2018-12-08 LAB — NOVEL CORONAVIRUS, NAA (HOSP ORDER, SEND-OUT TO REF LAB; TAT 18-24 HRS): SARS-CoV-2, NAA: NOT DETECTED

## 2018-12-08 NOTE — Progress Notes (Signed)
SPOKE W/  Voicemail     SCREENING SYMPTOMS OF COVID 19:   COUGH--  RUNNY NOSE---   SORE THROAT---  NASAL CONGESTION----  SNEEZING----  SHORTNESS OF BREATH---  DIFFICULTY BREATHING---  TEMP >100.0 -----  UNEXPLAINED BODY ACHES------  CHILLS --------   HEADACHES ---------  LOSS OF SMELL/ TASTE --------    HAVE YOU OR ANY FAMILY MEMBER TRAVELLED PAST 14 DAYS OUT OF THE   COUNTY--- STATE---- COUNTRY----  HAVE YOU OR ANY FAMILY MEMBER BEEN EXPOSED TO ANYONE WITH COVID 19?

## 2018-12-08 NOTE — Progress Notes (Signed)
SPOKE W/  _Ruth Hook     SCREENING SYMPTOMS OF COVID 19:   COUGH--NO  RUNNY NOSE---NO  SORE THROAT---NO  NASAL CONGESTION----NO  SNEEZING----NO  SHORTNESS OF BREATH---NO  DIFFICULTY BREATHING---NO  TEMP >100.0 -----NO  UNEXPLAINED BODY ACHES------NO  CHILLS --------NO  HEADACHES ---------NO  LOSS OF SMELL/ TASTE --------NO    HAVE YOU OR ANY FAMILY MEMBER TRAVELLED PAST 14 DAYS OUT OF THE   COUNTY---NO STATE----NO COUNTRY----NO  HAVE YOU OR ANY FAMILY MEMBER BEEN EXPOSED TO ANYONE WITH COVID 19? NO

## 2018-12-09 ENCOUNTER — Other Ambulatory Visit: Payer: Self-pay

## 2018-12-09 ENCOUNTER — Encounter (HOSPITAL_COMMUNITY): Payer: Self-pay | Admitting: Registered Nurse

## 2018-12-09 ENCOUNTER — Encounter (HOSPITAL_COMMUNITY)
Admission: RE | Disposition: A | Discharge status: Home Health | Payer: Self-pay | Source: Home / Self Care | Attending: Orthopedic Surgery

## 2018-12-09 ENCOUNTER — Inpatient Hospital Stay (HOSPITAL_COMMUNITY)
Admission: RE | Admit: 2018-12-09 | Discharge: 2018-12-10 | DRG: 470 | Disposition: A | Discharge status: Home Health | Payer: Medicare HMO | Attending: Orthopedic Surgery | Admitting: Orthopedic Surgery

## 2018-12-09 ENCOUNTER — Inpatient Hospital Stay (HOSPITAL_COMMUNITY): Payer: Medicare HMO

## 2018-12-09 ENCOUNTER — Inpatient Hospital Stay (HOSPITAL_COMMUNITY): Payer: Medicare HMO | Admitting: Anesthesiology

## 2018-12-09 ENCOUNTER — Inpatient Hospital Stay (HOSPITAL_COMMUNITY): Payer: Medicare HMO | Admitting: Physician Assistant

## 2018-12-09 DIAGNOSIS — Z90710 Acquired absence of both cervix and uterus: Secondary | ICD-10-CM | POA: Diagnosis not present

## 2018-12-09 DIAGNOSIS — Z853 Personal history of malignant neoplasm of breast: Secondary | ICD-10-CM | POA: Diagnosis not present

## 2018-12-09 DIAGNOSIS — Z96649 Presence of unspecified artificial hip joint: Secondary | ICD-10-CM

## 2018-12-09 DIAGNOSIS — M25751 Osteophyte, right hip: Secondary | ICD-10-CM | POA: Diagnosis present

## 2018-12-09 DIAGNOSIS — Z1159 Encounter for screening for other viral diseases: Secondary | ICD-10-CM

## 2018-12-09 DIAGNOSIS — M1611 Unilateral primary osteoarthritis, right hip: Secondary | ICD-10-CM | POA: Diagnosis present

## 2018-12-09 DIAGNOSIS — Z79899 Other long term (current) drug therapy: Secondary | ICD-10-CM

## 2018-12-09 DIAGNOSIS — M169 Osteoarthritis of hip, unspecified: Secondary | ICD-10-CM

## 2018-12-09 DIAGNOSIS — Z9012 Acquired absence of left breast and nipple: Secondary | ICD-10-CM | POA: Diagnosis not present

## 2018-12-09 DIAGNOSIS — Z87891 Personal history of nicotine dependence: Secondary | ICD-10-CM

## 2018-12-09 DIAGNOSIS — M25551 Pain in right hip: Secondary | ICD-10-CM

## 2018-12-09 DIAGNOSIS — Z8542 Personal history of malignant neoplasm of other parts of uterus: Secondary | ICD-10-CM

## 2018-12-09 DIAGNOSIS — Z9221 Personal history of antineoplastic chemotherapy: Secondary | ICD-10-CM

## 2018-12-09 HISTORY — PX: TOTAL HIP ARTHROPLASTY: SHX124

## 2018-12-09 LAB — TYPE AND SCREEN
ABO/RH(D): A POS
Antibody Screen: NEGATIVE

## 2018-12-09 SURGERY — ARTHROPLASTY, HIP, TOTAL, ANTERIOR APPROACH
Anesthesia: Spinal | Site: Hip | Laterality: Right

## 2018-12-09 MED ORDER — POVIDONE-IODINE 10 % EX SWAB: 2.0000 "application " | Freq: Once | CUTANEOUS | Status: DC

## 2018-12-09 MED ORDER — HYDROMORPHONE HCL 1 MG/ML IJ SOLN: 0.2500 mg | INTRAMUSCULAR | Status: DC | PRN

## 2018-12-09 MED ORDER — HYDROCODONE-ACETAMINOPHEN 5-325 MG PO TABS
1.0000 | ORAL_TABLET | ORAL | Status: DC | PRN
  Administered 2018-12-09 – 2018-12-10 (×4): 1 via ORAL
  Filled 2018-12-09 (×3): qty 1
  Filled 2018-12-09: qty 2

## 2018-12-09 MED ORDER — METOCLOPRAMIDE HCL 5 MG/ML IJ SOLN: 5.0000 mg | Freq: Three times a day (TID) | INTRAMUSCULAR | Status: DC | PRN

## 2018-12-09 MED ORDER — SODIUM CHLORIDE 0.9 % IV SOLN
INTRAVENOUS | Status: DC
  Administered 2018-12-09: 18:00:00 via INTRAVENOUS

## 2018-12-09 MED ORDER — ONDANSETRON HCL 4 MG/2ML IJ SOLN
INTRAMUSCULAR | Status: AC
  Filled 2018-12-09: qty 2

## 2018-12-09 MED ORDER — ACETAMINOPHEN 10 MG/ML IV SOLN
1000.0000 mg | Freq: Once | INTRAVENOUS | Status: AC
  Filled 2018-12-09: qty 100

## 2018-12-09 MED ORDER — PROPOFOL 500 MG/50ML IV EMUL
INTRAVENOUS | Status: DC | PRN
  Administered 2018-12-09: 25 ug/kg/min via INTRAVENOUS

## 2018-12-09 MED ORDER — FENTANYL CITRATE (PF) 100 MCG/2ML IJ SOLN
INTRAMUSCULAR | Status: DC | PRN
  Administered 2018-12-09 (×2): 50 ug via INTRAVENOUS

## 2018-12-09 MED ORDER — POLYETHYLENE GLYCOL 3350 17 G PO PACK: 17.0000 g | PACK | Freq: Every day | ORAL | Status: DC | PRN

## 2018-12-09 MED ORDER — 0.9 % SODIUM CHLORIDE (POUR BTL) OPTIME
TOPICAL | Status: DC | PRN
  Administered 2018-12-09: 13:00:00 1000 mL

## 2018-12-09 MED ORDER — FENTANYL CITRATE (PF) 100 MCG/2ML IJ SOLN
INTRAMUSCULAR | Status: AC
  Filled 2018-12-09: qty 2

## 2018-12-09 MED ORDER — METHOCARBAMOL 500 MG IVPB - SIMPLE MED
500.0000 mg | Freq: Four times a day (QID) | INTRAVENOUS | Status: DC | PRN
  Filled 2018-12-09: qty 50

## 2018-12-09 MED ORDER — BUPIVACAINE-EPINEPHRINE (PF) 0.25% -1:200000 IJ SOLN
INTRAMUSCULAR | Status: AC
  Filled 2018-12-09: qty 30

## 2018-12-09 MED ORDER — FLEET ENEMA 7-19 GM/118ML RE ENEM: 1.0000 | ENEMA | Freq: Once | RECTAL | Status: DC | PRN

## 2018-12-09 MED ORDER — PHENOL 1.4 % MT LIQD
1.0000 | OROMUCOSAL | Status: DC | PRN
  Filled 2018-12-09: qty 177

## 2018-12-09 MED ORDER — DOCUSATE SODIUM 100 MG PO CAPS
100.0000 mg | ORAL_CAPSULE | Freq: Two times a day (BID) | ORAL | Status: DC
  Administered 2018-12-09 – 2018-12-10 (×2): 100 mg via ORAL
  Filled 2018-12-09 (×2): qty 1

## 2018-12-09 MED ORDER — DEXAMETHASONE SODIUM PHOSPHATE 10 MG/ML IJ SOLN
INTRAMUSCULAR | Status: DC | PRN
  Administered 2018-12-09: 5 mg via INTRAVENOUS

## 2018-12-09 MED ORDER — TRANEXAMIC ACID-NACL 1000-0.7 MG/100ML-% IV SOLN
1000.0000 mg | INTRAVENOUS | Status: AC
  Administered 2018-12-09: 13:00:00 1000 mg via INTRAVENOUS
  Filled 2018-12-09: qty 100

## 2018-12-09 MED ORDER — LACTATED RINGERS IV SOLN
INTRAVENOUS | Status: DC
  Administered 2018-12-09 (×3): via INTRAVENOUS

## 2018-12-09 MED ORDER — RIVAROXABAN 10 MG PO TABS
10.0000 mg | ORAL_TABLET | Freq: Every day | ORAL | Status: DC
  Administered 2018-12-10: 10 mg via ORAL
  Filled 2018-12-09: qty 1

## 2018-12-09 MED ORDER — DIPHENHYDRAMINE HCL 12.5 MG/5ML PO ELIX: 12.5000 mg | ORAL_SOLUTION | ORAL | Status: DC | PRN

## 2018-12-09 MED ORDER — STERILE WATER FOR IRRIGATION IR SOLN
Status: DC | PRN
  Administered 2018-12-09 (×2): 1000 mL

## 2018-12-09 MED ORDER — ONDANSETRON HCL 4 MG PO TABS: 4.0000 mg | ORAL_TABLET | Freq: Four times a day (QID) | ORAL | Status: DC | PRN

## 2018-12-09 MED ORDER — DEXAMETHASONE SODIUM PHOSPHATE 10 MG/ML IJ SOLN
10.0000 mg | Freq: Once | INTRAMUSCULAR | Status: AC
  Administered 2018-12-10: 10 mg via INTRAVENOUS
  Filled 2018-12-09: qty 1

## 2018-12-09 MED ORDER — TRAMADOL HCL 50 MG PO TABS: 50.0000 mg | ORAL_TABLET | Freq: Four times a day (QID) | ORAL | Status: DC | PRN

## 2018-12-09 MED ORDER — MENTHOL 3 MG MT LOZG: 1.0000 | LOZENGE | OROMUCOSAL | Status: DC | PRN

## 2018-12-09 MED ORDER — ONDANSETRON HCL 4 MG/2ML IJ SOLN: 4.0000 mg | Freq: Four times a day (QID) | INTRAMUSCULAR | Status: DC | PRN

## 2018-12-09 MED ORDER — BUPIVACAINE-EPINEPHRINE (PF) 0.25% -1:200000 IJ SOLN
INTRAMUSCULAR | Status: DC | PRN
  Administered 2018-12-09: 30 mL

## 2018-12-09 MED ORDER — BISACODYL 10 MG RE SUPP: 10.0000 mg | Freq: Every day | RECTAL | Status: DC | PRN

## 2018-12-09 MED ORDER — METHOCARBAMOL 500 MG PO TABS
500.0000 mg | ORAL_TABLET | Freq: Four times a day (QID) | ORAL | Status: DC | PRN
  Administered 2018-12-09 – 2018-12-10 (×3): 500 mg via ORAL
  Filled 2018-12-09 (×3): qty 1

## 2018-12-09 MED ORDER — PROPOFOL 10 MG/ML IV BOLUS
INTRAVENOUS | Status: AC
  Filled 2018-12-09: qty 60

## 2018-12-09 MED ORDER — CEFAZOLIN SODIUM-DEXTROSE 2-4 GM/100ML-% IV SOLN
2.0000 g | INTRAVENOUS | Status: AC
  Administered 2018-12-09: 12:00:00 2 g via INTRAVENOUS
  Filled 2018-12-09: qty 100

## 2018-12-09 MED ORDER — SODIUM CHLORIDE 0.9 % IV SOLN
INTRAVENOUS | Status: DC | PRN
  Administered 2018-12-09: 13:00:00 35 ug/min via INTRAVENOUS

## 2018-12-09 MED ORDER — CEFAZOLIN SODIUM-DEXTROSE 1-4 GM/50ML-% IV SOLN
1.0000 g | Freq: Four times a day (QID) | INTRAVENOUS | Status: AC
  Administered 2018-12-09 – 2018-12-10 (×2): 1 g via INTRAVENOUS
  Filled 2018-12-09 (×2): qty 50

## 2018-12-09 MED ORDER — MORPHINE SULFATE (PF) 2 MG/ML IV SOLN: 0.5000 mg | INTRAVENOUS | Status: DC | PRN

## 2018-12-09 MED ORDER — DEXAMETHASONE SODIUM PHOSPHATE 10 MG/ML IJ SOLN
INTRAMUSCULAR | Status: AC
  Filled 2018-12-09: qty 1

## 2018-12-09 MED ORDER — PROMETHAZINE HCL 25 MG/ML IJ SOLN: 6.2500 mg | INTRAMUSCULAR | Status: DC | PRN

## 2018-12-09 MED ORDER — METOCLOPRAMIDE HCL 5 MG PO TABS: 5.0000 mg | ORAL_TABLET | Freq: Three times a day (TID) | ORAL | Status: DC | PRN

## 2018-12-09 MED ORDER — CHLORHEXIDINE GLUCONATE 4 % EX LIQD: 60.0000 mL | Freq: Once | CUTANEOUS | Status: DC

## 2018-12-09 MED ORDER — ACETAMINOPHEN 500 MG PO TABS
500.0000 mg | ORAL_TABLET | Freq: Four times a day (QID) | ORAL | Status: DC
  Administered 2018-12-09 – 2018-12-10 (×3): 500 mg via ORAL
  Filled 2018-12-09 (×3): qty 1

## 2018-12-09 MED ORDER — ONDANSETRON HCL 4 MG/2ML IJ SOLN
INTRAMUSCULAR | Status: DC | PRN
  Administered 2018-12-09: 4 mg via INTRAVENOUS

## 2018-12-09 SURGICAL SUPPLY — 46 items
BAG DECANTER FOR FLEXI CONT (MISCELLANEOUS) IMPLANT
BAG ZIPLOCK 12X15 (MISCELLANEOUS) IMPLANT
BALL HIP ARTICU 28 +5 (Hips) ×1 IMPLANT
BLADE SAG 18X100X1.27 (BLADE) ×3 IMPLANT
CLOSURE WOUND 1/2 X4 (GAUZE/BANDAGES/DRESSINGS) ×1
COVER PERINEAL POST (MISCELLANEOUS) ×3 IMPLANT
COVER SURGICAL LIGHT HANDLE (MISCELLANEOUS) ×3 IMPLANT
COVER WAND RF STERILE (DRAPES) IMPLANT
CUP ACETBLR 48 OD SECTOR II (Hips) ×3 IMPLANT
DECANTER SPIKE VIAL GLASS SM (MISCELLANEOUS) ×3 IMPLANT
DRAPE STERI IOBAN 125X83 (DRAPES) ×3 IMPLANT
DRAPE U-SHAPE 47X51 STRL (DRAPES) ×6 IMPLANT
DRSG ADAPTIC 3X8 NADH LF (GAUZE/BANDAGES/DRESSINGS) ×3 IMPLANT
DRSG MEPILEX BORDER 4X4 (GAUZE/BANDAGES/DRESSINGS) ×3 IMPLANT
DRSG MEPILEX BORDER 4X8 (GAUZE/BANDAGES/DRESSINGS) ×3 IMPLANT
DURAPREP 26ML APPLICATOR (WOUND CARE) ×3 IMPLANT
ELECT REM PT RETURN 15FT ADLT (MISCELLANEOUS) ×3 IMPLANT
EVACUATOR 1/8 PVC DRAIN (DRAIN) ×3 IMPLANT
GLOVE BIO SURGEON STRL SZ 6 (GLOVE) IMPLANT
GLOVE BIO SURGEON STRL SZ7 (GLOVE) IMPLANT
GLOVE BIO SURGEON STRL SZ8 (GLOVE) ×3 IMPLANT
GLOVE BIOGEL PI IND STRL 6.5 (GLOVE) IMPLANT
GLOVE BIOGEL PI IND STRL 7.0 (GLOVE) IMPLANT
GLOVE BIOGEL PI IND STRL 8 (GLOVE) ×1 IMPLANT
GLOVE BIOGEL PI INDICATOR 6.5 (GLOVE)
GLOVE BIOGEL PI INDICATOR 7.0 (GLOVE)
GLOVE BIOGEL PI INDICATOR 8 (GLOVE) ×2
GOWN STRL REUS W/TWL LRG LVL3 (GOWN DISPOSABLE) ×3 IMPLANT
GOWN STRL REUS W/TWL XL LVL3 (GOWN DISPOSABLE) IMPLANT
HIP BALL ARTICU 28 +5 (Hips) ×3 IMPLANT
HOLDER FOLEY CATH W/STRAP (MISCELLANEOUS) ×3 IMPLANT
KIT TURNOVER KIT A (KITS) IMPLANT
LINER MARATHON 28 48 (Hips) ×3 IMPLANT
MANIFOLD NEPTUNE II (INSTRUMENTS) ×3 IMPLANT
PACK ANTERIOR HIP CUSTOM (KITS) ×3 IMPLANT
STEM FEMORAL SZ 6MM STD ACTIS (Stem) ×3 IMPLANT
STRIP CLOSURE SKIN 1/2X4 (GAUZE/BANDAGES/DRESSINGS) ×2 IMPLANT
SUT ETHIBOND NAB CT1 #1 30IN (SUTURE) ×3 IMPLANT
SUT MNCRL AB 4-0 PS2 18 (SUTURE) ×3 IMPLANT
SUT STRATAFIX 0 PDS 27 VIOLET (SUTURE) ×3
SUT VIC AB 2-0 CT1 27 (SUTURE) ×4
SUT VIC AB 2-0 CT1 TAPERPNT 27 (SUTURE) ×2 IMPLANT
SUTURE STRATFX 0 PDS 27 VIOLET (SUTURE) ×1 IMPLANT
SYR 50ML LL SCALE MARK (SYRINGE) IMPLANT
TRAY FOLEY MTR SLVR 16FR STAT (SET/KITS/TRAYS/PACK) ×3 IMPLANT
YANKAUER SUCT BULB TIP 10FT TU (MISCELLANEOUS) ×3 IMPLANT

## 2018-12-09 NOTE — Evaluation (Signed)
Physical Therapy Evaluation Patient Details Name: Heather Rhodes MRN: [id number] DOB: [birth year] Today's Date: 12/09/2018   History of Present Illness  R AA-THA  Clinical Impression  Pt is s/p THA resulting in the deficits listed below (see PT Problem List). Pt ambulated 67' with RW, no loss of balance. Good progress expected.  Pt will benefit from skilled PT to increase their independence and safety with mobility to allow discharge to the venue listed below.      Follow Up Recommendations Follow surgeon's recommendation for DC plan and follow-up therapies    Equipment Recommendations  None recommended by PT    Recommendations for Other Services       Precautions / Restrictions Precautions Precautions: Fall Restrictions Weight Bearing Restrictions: No Other Position/Activity Restrictions: WBAT      Mobility  Bed Mobility Overal bed mobility: Needs Assistance Bed Mobility: Sidelying to Sit   Sidelying to sit: Min assist       General bed mobility comments: min A RLE  Transfers Overall transfer level: Needs assistance Equipment used: Rolling walker (2 wheeled) Transfers: Sit to/from Stand Sit to Stand: Min assist         General transfer comment: VCs hand placement  Ambulation/Gait Ambulation/Gait assistance: Min guard Gait Distance (Feet): 35 Feet Assistive device: Rolling walker (2 wheeled) Gait Pattern/deviations: Step-to pattern;Decreased step length - right;Decreased step length - left Gait velocity: decr   General Gait Details: VCs sequencing and positioning in RW  Stairs            Wheelchair Mobility    Modified Rankin (Stroke Patients Only)       Balance Overall balance assessment: Modified Independent                                           Pertinent Vitals/Pain Pain Assessment: 0-10 Pain Score: 6  Pain Location: R hip Pain Descriptors / Indicators: Sore Pain Intervention(s): Limited activity within  patient's tolerance;Monitored during session;Premedicated before session;Ice applied    Home Living Family/patient expects to be discharged to:: Private residence Living Arrangements: Alone Available Help at Discharge: Available 24 hours/day(will have someone staying with her for first couple days) Type of Home: Independent living facility Home Access: Level entry     Home Layout: One level Home Equipment: [place] - 2 wheels;Shower seat;Grab bars - toilet;Grab bars - tub/shower;Cane - single point(3 wheeled RW)      Prior Function Level of Independence: Independent with assistive device(s)         Comments: walked with 3 wheeled RW     Hand Dominance        Extremity/Trunk Assessment   Upper Extremity Assessment Upper Extremity Assessment: Overall WFL for tasks assessed    Lower Extremity Assessment Lower Extremity Assessment: RLE deficits/detail RLE Deficits / Details: R knee ext at least 3/5, R hip AAROM 45*, R hip abduction ~15* RLE Sensation: WNL    Cervical / Trunk Assessment Cervical / Trunk Assessment: Kyphotic  Communication   Communication: No difficulties  Cognition Arousal/Alertness: Awake/alert Behavior During Therapy: WFL for tasks assessed/performed Overall Cognitive Status: Within Functional Limits for tasks assessed                                        General Comments  Exercises Total Joint Exercises Ankle Circles/Pumps: AROM;Both;10 reps;Supine Heel Slides: AAROM;Right;Supine;5 reps Hip ABduction/ADduction: AAROM;5 reps;Supine;Right Long Arc Quad: AROM;Right;10 reps;Seated   Assessment/Plan    PT Assessment Patient needs continued PT services  PT Problem List Decreased activity tolerance;Pain;Decreased mobility       PT Treatment Interventions DME instruction;Gait training;Therapeutic activities;Therapeutic exercise;Patient/family education    PT Goals (Current goals can be found in the Care Plan section)   Acute Rehab PT Goals Patient Stated Goal: yoga, 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 PT Goal Formulation: With patient Time For Goal Achievement: 12/16/18 Potential to Achieve Goals: Good    Frequency 7X/week   Barriers to discharge        Co-evaluation               AM-PAC PT "6 Clicks" Mobility  Outcome Measure Help needed turning from your back to your side while in a flat bed without using bedrails?: A Little Help needed moving from lying on your back to sitting on the side of a flat bed without using bedrails?: A Little Help needed moving to and from a bed to a chair (including a wheelchair)?: A Little Help needed standing up from a chair using your arms (e.g., wheelchair or bedside chair)?: A Little Help needed to walk in hospital room?: A Little Help needed climbing 3-5 steps with a railing? : A Lot 6 Click Score: 17    End of Session Equipment Utilized During Treatment: Gait belt Activity Tolerance: Patient tolerated treatment well Patient left: in chair;with call bell/phone within reach Nurse Communication: Mobility status PT Visit Diagnosis: Difficulty in walking, not elsewhere classified (R26.2);Pain Pain - Right/Left: Right Pain - part of body: Hip    Time: 5573-2202 PT Time Calculation (min) (ACUTE ONLY): 23 min   Charges:   PT Evaluation $PT Eval Low Complexity: 1 Low PT Treatments $Gait Training: 8-22 mins       Blondell Reveal Kistler PT 12/09/2018  Acute Rehabilitation Services Pager [phone] Office [phone]

## 2018-12-09 NOTE — Anesthesia Procedure Notes (Signed)
Spinal  Patient location during procedure: OR End time: 12/09/2018 12:37 PM Staffing Anesthesiologist: Myrtie Soman, MD Performed: anesthesiologist  Preanesthetic Checklist Completed: patient identified, surgical consent, pre-op evaluation, timeout performed, IV checked, risks and benefits discussed and monitors and equipment checked Spinal Block Patient position: sitting Prep: Betadine and DuraPrep Patient monitoring: heart rate, continuous pulse ox and blood pressure Location: L3-4 Injection technique: single-shot Needle Needle type: Sprotte and Quincke  Needle gauge: 22 G Needle length: 9 cm Assessment Sensory level: T6 Additional Notes Expiration date of kit checked and confirmed. Patient tolerated procedure well, without complications.

## 2018-12-09 NOTE — Transfer of Care (Signed)
Immediate Anesthesia Transfer of Care Note  Patient: Heather Rhodes  Procedure(s) Performed: RIGHT TOTAL HIP ARTHROPLASTY ANTERIOR APPROACH (Right Hip)  Patient Location: PACU  Anesthesia Type:Spinal  Level of Consciousness: awake, alert , oriented and patient cooperative  Airway & Oxygen Therapy: Patient Spontanous Breathing and Patient connected to face mask oxygen  Post-op Assessment: Report given to RN and Post -op Vital signs reviewed and stable  Post vital signs: stable  Last Vitals:  Vitals Value Taken Time  BP 82/50 12/09/2018  2:22 PM  Temp    Pulse 74 12/09/2018  2:25 PM  Resp 17 12/09/2018  2:25 PM  SpO2 99 % 12/09/2018  2:25 PM  Vitals shown include unvalidated device data.  Last Pain:  Vitals:   12/09/18 1130  TempSrc:   PainSc: 4          Complications: No apparent anesthesia complications

## 2018-12-09 NOTE — Discharge Instructions (Addendum)
°Dr. Frank Aluisio °Total Joint Specialist °[facility] °[address] °[place], [place] [address] °[phone] ° °ANTERIOR APPROACH TOTAL HIP REPLACEMENT POSTOPERATIVE DIRECTIONS ° ° °Hip Rehabilitation, Guidelines Following Surgery  °The results of a hip operation are greatly improved after range of motion and muscle strengthening exercises. Follow all safety measures which are given to protect your hip. If any of these exercises cause increased pain or swelling in your joint, decrease the amount until you are comfortable again. Then slowly increase the exercises. Call your caregiver if you have problems or questions.  ° °HOME CARE INSTRUCTIONS  °• Remove items at home which could result in a fall. This includes throw rugs or furniture in walking pathways.  °· ICE to the affected hip every three hours for 30 minutes at a time and then as needed for pain and swelling.  Continue to use ice on the hip for pain and swelling from surgery. You may notice swelling that will progress down to the foot and ankle.  This is normal after surgery.  Elevate the leg when you are not up walking on it.   °· Continue to use the breathing machine which will help keep your temperature down.  It is common for your temperature to cycle up and down following surgery, especially at night when you are not up moving around and exerting yourself.  The breathing machine keeps your lungs expanded and your temperature down. ° °DIET °You may resume your previous home diet once your are discharged from the hospital. ° °DRESSING / WOUND CARE / SHOWERING °You may change your dressing 3-5 days after surgery.  Then change the dressing every day with sterile gauze.  Please use good hand washing techniques before changing the dressing.  Do not use any lotions or creams on the incision until instructed by your surgeon. °You may start showering once you are discharged home but do not submerge the incision under water. Just pat the  incision dry and apply a dry gauze dressing on daily. °Change the surgical dressing daily and reapply a dry dressing each time. ° °ACTIVITY °Walk with your walker as instructed. °Use walker as long as suggested by your caregivers. °Avoid periods of inactivity such as sitting longer than an hour when not asleep. This helps prevent blood clots.  °You may resume a sexual relationship in one month or when given the OK by your doctor.  °You may return to work once you are cleared by your doctor.  °Do not drive a car for 6 weeks or until released by you surgeon.  °Do not drive while taking narcotics. ° °WEIGHT BEARING °Weight bearing as tolerated with assist device (walker, cane, etc) as directed, use it as long as suggested by your surgeon or therapist, typically at least 4-6 weeks. ° °POSTOPERATIVE CONSTIPATION PROTOCOL °Constipation - defined medically as fewer than three stools per week and severe constipation as less than one stool per week. ° °One of the most common issues patients have following surgery is constipation.  Even if you have a regular bowel pattern at home, your normal regimen is likely to be disrupted due to multiple reasons following surgery.  Combination of anesthesia, postoperative narcotics, change in appetite and fluid intake all can affect your bowels.  In order to avoid complications following surgery, here are some recommendations in order to help you during your recovery period. ° °Colace (docusate) - Pick up an over-the-counter form of Colace or another stool softener and take twice a day   as long as you are requiring postoperative pain medications.  Take with a full glass of water daily.  If you experience loose stools or diarrhea, hold the colace until you stool forms back up.  If your symptoms do not get better within 1 week or if they get worse, check with your doctor. ° °Dulcolax (bisacodyl) - Pick up over-the-counter and take as directed by the product packaging as needed to assist with  the movement of your bowels.  Take with a full glass of water.  Use this product as needed if not relieved by Colace only.  ° °MiraLax (polyethylene glycol) - Pick up over-the-counter to have on hand.  MiraLax is a solution that will increase the amount of water in your bowels to assist with bowel movements.  Take as directed and can mix with a glass of water, juice, soda, coffee, or tea.  Take if you go more than two days without a movement. °Do not use MiraLax more than once per day. Call your doctor if you are still constipated or irregular after using this medication for 7 days in a row. ° °If you continue to have problems with postoperative constipation, please contact the office for further assistance and recommendations.  If you experience "the worst abdominal pain ever" or develop nausea or vomiting, please contact the office immediatly for further recommendations for treatment. ° °ITCHING ° If you experience itching with your medications, try taking only a single pain pill, or even half a pain pill at a time.  You can also use Benadryl over the counter for itching or also to help with sleep.  ° °TED HOSE STOCKINGS °Wear the elastic stockings on both legs for three weeks following surgery during the day but you may remove then at night for sleeping. ° °MEDICATIONS °See your medication summary on the “After Visit Summary” that the nursing staff will review with you prior to discharge.  You may have some home medications which will be placed on hold until you complete the course of blood thinner medication.  It is important for you to complete the blood thinner medication as prescribed by your surgeon.  Continue your approved medications as instructed at time of discharge. ° °PRECAUTIONS °If you experience chest pain or shortness of breath - call 911 immediately for transfer to the hospital emergency department.  °If you develop a fever greater that 101 F, purulent drainage from wound, increased redness or  drainage from wound, foul odor from the wound/dressing, or calf pain - CONTACT YOUR SURGEON.   °                                                °FOLLOW-UP APPOINTMENTS °Make sure you keep all of your appointments after your operation with your surgeon and caregivers. You should call the office at the above phone number and make an appointment for approximately two weeks after the date of your surgery or on the date instructed by your surgeon outlined in the "After Visit Summary". ° °RANGE OF MOTION AND STRENGTHENING EXERCISES  °These exercises are designed to help you keep full movement of your hip joint. Follow your caregiver's or physical therapist's instructions. Perform all exercises about fifteen times, three times per day or as directed. Exercise both hips, even if you have had only one joint replacement. These exercises can be done on   a training (exercise) mat, on the floor, on a table or on a bed. Use whatever works the best and is most comfortable for you. Use music or television while you are exercising so that the exercises are a pleasant break in your day. This will make your life better with the exercises acting as a break in routine you can look forward to.   Lying on your back, slowly slide your foot toward your buttocks, raising your knee up off the floor. Then slowly slide your foot back down until your leg is straight again.   Lying on your back spread your legs as far apart as you can without causing discomfort.   Lying on your side, raise your upper leg and foot straight up from the floor as far as is comfortable. Slowly lower the leg and repeat.   Lying on your back, tighten up the muscle in the front of your thigh (quadriceps muscles). You can do this by keeping your leg straight and trying to raise your heel off the floor. This helps strengthen the largest muscle supporting your knee.   Lying on your back, tighten up the muscles of your buttocks both with the legs straight and with  the knee bent at a comfortable angle while keeping your heel on the floor.   IF YOU ARE TRANSFERRED TO A SKILLED REHAB FACILITY If the patient is transferred to a skilled rehab facility following release from the hospital, a list of the current medications will be sent to the facility for the patient to continue.  When discharged from the skilled rehab facility, please have the facility set up the patient's [place] prior to being released. Also, the skilled facility will be responsible for providing the patient with their medications at time of release from the facility to include their pain medication, the muscle relaxants, and their blood thinner medication. If the patient is still at the rehab facility at time of the two week follow up appointment, the skilled rehab facility will also need to assist the patient in arranging follow up appointment in our office and any transportation needs.  MAKE SURE YOU:   Understand these instructions.   Get help right away if you are not doing well or get worse.    Pick up stool softner and laxative for home use following surgery while on pain medications. Do not submerge incision under water. Please use good hand washing techniques while changing dressing each day. May shower starting three days after surgery. Please use a clean towel to pat the incision dry following showers. Continue to use ice for pain and swelling after surgery. Do not use any lotions or creams on the incision until instructed by your surgeon.  __________________________________________________________  Information on my medicine - XARELTO (Rivaroxaban)  This medication education was reviewed with me or my healthcare representative as part of my discharge preparation.    Why was Xarelto prescribed for you? Xarelto was prescribed for you to reduce the risk of blood clots forming after orthopedic surgery. The medical term for these abnormal blood clots is  venous thromboembolism (VTE).  What do you need to know about xarelto ? Take your Xarelto ONCE DAILY at the same time every day. You may take it either with or without food.  If you have difficulty swallowing the tablet whole, you may crush it and mix in applesauce just prior to taking your dose.  Take Xarelto exactly as prescribed by your doctor and DO NOT stop  taking Xarelto without talking to the doctor who prescribed the medication.  Stopping without other VTE prevention medication to take the place of Xarelto may increase your risk of developing a clot.  After discharge, you should have regular check-up appointments with your healthcare provider that is prescribing your Xarelto.    What do you do if you miss a dose? If you miss a dose, take it as soon as you remember on the same day then continue your regularly scheduled once daily regimen the next day. Do not take two doses of Xarelto on the same day.   Important Safety Information A possible side effect of Xarelto is bleeding. You should call your healthcare provider right away if you experience any of the following: ? Bleeding from an injury or your nose that does not stop. ? Unusual colored urine (red or dark brown) or unusual colored stools (red or black). ? Unusual bruising for unknown reasons. ? A serious fall or if you hit your head (even if there is no bleeding).  Some medicines may interact with Xarelto and might increase your risk of bleeding while on Xarelto. To help avoid this, consult your healthcare provider or pharmacist prior to using any new prescription or non-prescription medications, including herbals, vitamins, non-steroidal anti-inflammatory drugs (NSAIDs) and supplements.  This website has more information on Xarelto: [email or web address]

## 2018-12-09 NOTE — Interval H&P Note (Signed)
History and Physical Interval Note:  12/09/2018 11:20 AM  Smitty Pluck  has presented today for surgery, with the diagnosis of right hip osteoarthritis.  The various methods of treatment have been discussed with the patient and family. After consideration of risks, benefits and other options for treatment, the patient has consented to  Procedure(s): TOTAL HIP ARTHROPLASTY ANTERIOR APPROACH (Right) as a surgical intervention.  The patient's history has been reviewed, patient examined, no change in status, stable for surgery.  I have reviewed the patient's chart and labs.  Questions were answered to the patient's satisfaction.     Pilar Plate Debraann Livingstone

## 2018-12-09 NOTE — Op Note (Signed)
OPERATIVE REPORT- TOTAL HIP ARTHROPLASTY   PREOPERATIVE DIAGNOSIS: Osteoarthritis of the Right hip.   POSTOPERATIVE DIAGNOSIS: Osteoarthritis of the Right  hip.   PROCEDURE: Right total hip arthroplasty, anterior approach.   SURGEON: Gaynelle Arabian, MD   ASSISTANT: Theresa Duty PA-C  ANESTHESIA:  Spinal  ESTIMATED BLOOD LOSS:-200 mL    DRAINS: Hemovac x1.   COMPLICATIONS: None   CONDITION: PACU - hemodynamically stable.   BRIEF CLINICAL NOTE: Heather Rhodes is a 85 y.o. female who has advanced end-  stage arthritis of their Right  hip with progressively worsening pain and  dysfunction.The patient has failed nonoperative management and presents for  total hip arthroplasty.   PROCEDURE IN DETAIL: After successful administration of spinal  anesthetic, the traction boots for the [facility] bed were placed on both  feet and the patient was placed onto the [facility] bed, boots placed into the leg  holders. The Right hip was then isolated from the perineum with plastic  drapes and prepped and draped in the usual sterile fashion. ASIS and  greater trochanter were marked and a oblique incision was made, starting  at about 1 cm lateral and 2 cm distal to the ASIS and coursing towards  the anterior cortex of the femur. The skin was cut with a 10 blade  through subcutaneous tissue to the level of the fascia overlying the  tensor fascia lata muscle. The fascia was then incised in line with the  incision at the junction of the anterior third and posterior 2/3rd. The  muscle was teased off the fascia and then the interval between the TFL  and the rectus was developed. The Hohmann retractor was then placed at  the top of the femoral neck over the capsule. The vessels overlying the  capsule were cauterized and the fat on top of the capsule was removed.  A Hohmann retractor was then placed anterior underneath the rectus  femoris to give exposure to the entire anterior capsule. A T-shaped   capsulotomy was performed. The edges were tagged and the femoral head  was identified.       Osteophytes are removed off the superior acetabulum.  The femoral neck was then cut in situ with an oscillating saw. Traction  was then applied to the left lower extremity utilizing the [facility]  traction. The femoral head was then removed. Retractors were placed  around the acetabulum and then circumferential removal of the labrum was  performed. Osteophytes were also removed. Reaming starts at 45 mm to  medialize and  Increased in 2 mm increments to 47 mm. We reamed in  approximately 40 degrees of abduction, 20 degrees anteversion. A 48 mm  pinnacle acetabular shell was then impacted in anatomic position under  fluoroscopic guidance with excellent purchase. We did not need to place  any additional dome screws. A 28 mm + 4 marathon liner was then  placed into the acetabular shell.       The femoral lift was then placed along the lateral aspect of the femur  just distal to the vastus ridge. The leg was  externally rotated and capsule  was stripped off the inferior aspect of the femoral neck down to the  level of the lesser trochanter, this was done with electrocautery. The femur was lifted after this was performed. The  leg was then placed in an extended and adducted position essentially delivering the femur. We also removed the capsule superiorly and the piriformis from the piriformis fossa  to gain excellent exposure of the  proximal femur. Rongeur was used to remove some cancellous bone to get  into the lateral portion of the proximal femur for placement of the  initial starter reamer. The starter broaches was placed  the starter broach  and was shown to go down the center of the canal. Broaching  with the Actis system was then performed starting at size 0  coursing  Up to size 6. A size 6 had excellent torsional and rotational  and axial stability. The trial standard offset neck was then placed   with a 28 + 5 trial head. The hip was then reduced. We confirmed that  the stem was in the canal both on AP and lateral x-rays. It also has excellent sizing. The hip was reduced with outstanding stability through full extension and full external rotation.. AP pelvis was taken and the leg lengths were measured and found to be equal. Hip was then dislocated again and the femoral head and neck removed. The  femoral broach was removed. Size 6 Actis stem with a standard offset  neck was then impacted into the femur following native anteversion. Has  excellent purchase in the canal. Excellent torsional and rotational and  axial stability. It is confirmed to be in the canal on AP and lateral  fluoroscopic views. The 28 + 1.5 metal head was placed and the hip  reduced with outstanding stability. Again AP pelvis was taken and it  confirmed that the leg lengths were equal. The wound was then copiously  irrigated with saline solution and the capsule reattached and repaired  with Ethibond suture. 30 ml of .25% Bupivicaine was  injected into the capsule and into the edge of the tensor fascia lata as well as subcutaneous tissue. The fascia overlying the tensor fascia lata was then closed with a running #1 V-Loc. Subcu was closed with interrupted 2-0 Vicryl and subcuticular running 4-0 Monocryl. Incision was cleaned  and dried. Steri-Strips and a bulky sterile dressing applied. Hemovac  drain was hooked to suction and then the patient was awakened and transported to  recovery in stable condition.        Please note that a surgical assistant was a medical necessity for this procedure to perform it in a safe and expeditious manner. Assistant was necessary to provide appropriate retraction of vital neurovascular structures and to prevent femoral fracture and allow for anatomic placement of the prosthesis.  Gaynelle Arabian, M.D.

## 2018-12-09 NOTE — Anesthesia Procedure Notes (Signed)
Procedure Name: MAC Date/Time: 12/09/2018 12:25 PM Performed by: Lissa Morales, CRNA Pre-anesthesia Checklist: Patient identified, Suction available, Emergency Drugs available, Patient being monitored and Timeout performed Patient Re-evaluated:Patient Re-evaluated prior to induction Oxygen Delivery Method: Simple face mask Placement Confirmation: positive ETCO2

## 2018-12-09 NOTE — Anesthesia Postprocedure Evaluation (Signed)
Anesthesia Post Note  Patient: Heather Rhodes  Procedure(s) Performed: RIGHT TOTAL HIP ARTHROPLASTY ANTERIOR APPROACH (Right Hip)     Patient location during evaluation: PACU Anesthesia Type: Spinal Level of consciousness: oriented and awake and alert Pain management: pain level controlled Vital Signs Assessment: post-procedure vital signs reviewed and stable Respiratory status: spontaneous breathing, respiratory function stable and patient connected to nasal cannula oxygen Cardiovascular status: blood pressure returned to baseline and stable Postop Assessment: no headache, no backache and no apparent nausea or vomiting Anesthetic complications: no    Last Vitals:  Vitals:   12/09/18 1556 12/09/18 1749  BP: 105/60 (!) 106/52  Pulse: 72 83  Resp: 14 16  Temp: 36.8 C 36.5 C  SpO2: 100% 97%    Last Pain:  Vitals:   12/09/18 1722  TempSrc:   PainSc: 7                  Jhonny Calixto S

## 2018-12-09 NOTE — Anesthesia Preprocedure Evaluation (Signed)
Anesthesia Evaluation  Patient identified by MRN, date of birth, ID band Patient awake    Reviewed: Allergy & Precautions, NPO status , Patient's Chart, lab work & pertinent test results  Airway Mallampati: II  TM Distance: >3 FB Neck ROM: Full    Dental no notable dental hx.    Pulmonary neg pulmonary ROS, former smoker,    Pulmonary exam normal breath sounds clear to auscultation       Cardiovascular negative cardio ROS Normal cardiovascular exam Rhythm:Regular Rate:Normal     Neuro/Psych negative neurological ROS  negative psych ROS   GI/Hepatic negative GI ROS, Neg liver ROS,   Endo/Other  negative endocrine ROS  Renal/GU negative Renal ROS  negative genitourinary   Musculoskeletal  (+) Arthritis , Osteoarthritis,    Abdominal   Peds negative pediatric ROS (+)  Hematology negative hematology ROS (+)   Anesthesia Other Findings   Reproductive/Obstetrics negative OB ROS                             Anesthesia Physical Anesthesia Plan  ASA: II  Anesthesia Plan: Spinal   Post-op Pain Management:    Induction: Intravenous  PONV Risk Score and Plan: 2 and Ondansetron and Dexamethasone  Airway Management Planned: Simple Face Mask  Additional Equipment:   Intra-op Plan:   Post-operative Plan:   Informed Consent: I have reviewed the patients History and Physical, chart, labs and discussed the procedure including the risks, benefits and alternatives for the proposed anesthesia with the patient or authorized representative who has indicated his/her understanding and acceptance.     Dental advisory given  Plan Discussed with: CRNA and Surgeon  Anesthesia Plan Comments:         Anesthesia Quick Evaluation

## 2018-12-10 ENCOUNTER — Encounter (HOSPITAL_COMMUNITY): Payer: Self-pay | Admitting: Orthopedic Surgery

## 2018-12-10 LAB — BASIC METABOLIC PANEL
Anion gap: 6 (ref 5–15)
BUN: 17 mg/dL (ref 8–23)
CO2: 24 mmol/L (ref 22–32)
Calcium: 8.5 mg/dL — ABNORMAL LOW (ref 8.9–10.3)
Chloride: 107 mmol/L (ref 98–111)
Creatinine, Ser: 0.75 mg/dL (ref 0.44–1.00)
GFR calc Af Amer: >60 mL/min (ref >60)
GFR calc non Af Amer: >60 mL/min (ref >60)
Glucose, Bld: 126 mg/dL — ABNORMAL HIGH (ref 70–99)
Potassium: 4.1 mmol/L (ref 3.5–5.1)
Sodium: 137 mmol/L (ref 135–145)

## 2018-12-10 LAB — CBC
HCT: 32.7 % — ABNORMAL LOW (ref 36.0–46.0)
Hemoglobin: 10.9 g/dL — ABNORMAL LOW (ref 12.0–15.0)
MCH: 32.8 pg (ref 26.0–34.0)
MCHC: 33.3 g/dL (ref 30.0–36.0)
MCV: 98.5 fL (ref 80.0–100.0)
Platelets: 239 10*3/uL (ref 150–400)
RBC: 3.32 MIL/uL — ABNORMAL LOW (ref 3.87–5.11)
RDW: 12.8 % (ref 11.5–15.5)
WBC: 14.1 10*3/uL — ABNORMAL HIGH (ref 4.0–10.5)
nRBC: 0 % (ref 0.0–0.2)

## 2018-12-10 MED ORDER — TRAMADOL HCL 50 MG PO TABS: 50.0000 mg | ORAL_TABLET | Freq: Four times a day (QID) | ORAL | 0 refills | Status: DC | PRN

## 2018-12-10 MED ORDER — HYDROCODONE-ACETAMINOPHEN 5-325 MG PO TABS: 1.0000 | ORAL_TABLET | Freq: Four times a day (QID) | ORAL | 0 refills | Status: DC | PRN

## 2018-12-10 MED ORDER — METHOCARBAMOL 500 MG PO TABS: 500.0000 mg | ORAL_TABLET | Freq: Four times a day (QID) | ORAL | 0 refills | Status: DC | PRN

## 2018-12-10 MED ORDER — RIVAROXABAN 10 MG PO TABS: 10.0000 mg | ORAL_TABLET | Freq: Every day | ORAL | 0 refills | Status: DC

## 2018-12-10 NOTE — Plan of Care (Signed)
  Problem: Education: Goal: Knowledge of the prescribed therapeutic regimen will improve Outcome: Progressing   Problem: Pain Management: Goal: Pain level will decrease with appropriate interventions Outcome: Progressing   Problem: Clinical Measurements: Goal: Postoperative complications will be avoided or minimized Outcome: Progressing   Problem: Skin Integrity: Goal: Will show signs of wound healing Outcome: Progressing

## 2018-12-10 NOTE — Progress Notes (Addendum)
Physical Therapy Treatment Patient Details Name: Heather Rhodes MRN: [id number] DOB: 12-03-1929 Today's Date: 12/10/2018    History of Present Illness R AA-THA    PT Comments    Pt sitting in chair upon entrance, friendly and willing to participate. Began therex with therex for LE strengthening, pt able to demonstrate and verbalize appropraite mechanics with only cueing for posture and safety during standing exercises.  Gait training with RW min A, frequent cueing for heel strike, increase stride length and posture through gait, no LOB just slow cadence.  EOS pt left in chair with ice applied to hip for pain control, call bell within reach.     Follow Up Recommendations  Follow surgeon's recommendation for DC plan and follow-up therapies     Equipment Recommendations  Rolling walker with 5" wheels    Recommendations for Other Services       Precautions / Restrictions Precautions Precautions: Fall Restrictions Weight Bearing Restrictions: No Other Position/Activity Restrictions: WBAT    Mobility  Bed Mobility               General bed mobility comments: Sitting in chair upon entrance  Transfers Overall transfer level: Modified independent Equipment used: Rolling walker (2 wheeled) Transfers: Sit to/from Stand Sit to Stand: Min guard         General transfer comment: Cueing for hand placement prior standing  Ambulation/Gait Ambulation/Gait assistance: Min guard;Min assist Gait Distance (Feet): 80 Feet Assistive device: Rolling walker (2 wheeled) Gait Pattern/deviations: Step-through pattern;Decreased stride length;Decreased dorsiflexion - right;Decreased dorsiflexion - left;Decreased weight shift to right;Trunk flexed Gait velocity: slow   General Gait Details: frequent cueing for heel strike, sequencing wiht RW and posture   Stairs             Wheelchair Mobility    Modified Rankin (Stroke Patients Only)       Balance                                             Cognition Arousal/Alertness: Awake/alert Behavior During Therapy: WFL for tasks assessed/performed Overall Cognitive Status: Within Functional Limits for tasks assessed                                        Exercises Total Joint Exercises Ankle Circles/Pumps: 20 reps;Both;Seated Quad Sets: Right;10 reps;Seated(Long sitting position in recliner) Short Arc Quad: Strengthening;Right;10 reps;Seated(Long sitting position in recliner) Heel Slides: AAROM;Right;10 reps;Seated Hip ABduction/ADduction: Both;10 reps;Seated(Long sitting position in recliner) Long Arc Quad: Right;10 reps;Seated;Strengthening Knee Flexion: Both;10 reps;Standing(Complete in front of sink, encouraged to complete exercise with solid UE support) Marching in Standing: Strengthening;Both;10 reps(Complete in front of sink, encouraged to complete exercise with solid UE support)    General Comments        Pertinent Vitals/Pain Pain Assessment: No/denies pain    Home Living Family/patient expects to be discharged to:: Private residence Living Arrangements: Alone Available Help at Discharge: Available 24 hours/day(will have someone staying with her for first couple days) Type of Home: Independent living facility Home Access: Level entry     Home Layout: One level Home Equipment: Walker - 2 wheels;Shower seat;Grab bars - toilet;Grab bars - tub/shower;Cane - single point(3 wheeled RW)      Prior Function Level of Independence: Independent with assistive device(s)  PT Goals (current goals can now be found in the care plan section)      Frequency    7X/week      PT Plan Current plan remains appropriate    Co-evaluation              AM-PAC PT "6 Clicks" Mobility   Outcome Measure  Help needed turning from your back to your side while in a flat bed without using bedrails?: A Little Help needed moving from lying on your back to  sitting on the side of a flat bed without using bedrails?: A Little Help needed moving to and from a bed to a chair (including a wheelchair)?: A Little Help needed standing up from a chair using your arms (e.g., wheelchair or bedside chair)?: A Little Help needed to walk in hospital room?: A Little Help needed climbing 3-5 steps with a railing? : A Lot 6 Click Score: 17    End of Session Equipment Utilized During Treatment: Gait belt Activity Tolerance: Patient tolerated treatment well;No increased pain;Patient limited by fatigue Patient left: in chair;with call bell/phone within reach;with chair alarm set(Ice applied to knee, RN aware of status) Nurse Communication: Mobility status PT Visit Diagnosis: Difficulty in walking, not elsewhere classified (R26.2) Pain - Right/Left: Right Pain - part of body: Hip     Time: 1517-6160 PT Time Calculation (min) (ACUTE ONLY): 48 min  Charges:  $Gait Training: 8-22 mins $Therapeutic Exercise: 8-22 mins $Therapeutic Activity: 8-22 mins                     [address], [place]; [place]  Aldona Lento 12/10/2018, 12:37 PM

## 2018-12-10 NOTE — Progress Notes (Signed)
   Subjective: 1 Day Post-Op Procedure(s) (LRB): RIGHT TOTAL HIP ARTHROPLASTY ANTERIOR APPROACH (Right) Patient reports pain as mild.   Patient seen in rounds by Dr. Wynelle Link. Patient is well, and has had no acute complaints or problems. States she is feeling well this AM. Denies chest pain or SOB. Foley catheter removed, no issues overnight. Did well ambulating with physical therapy yesterday, will continue working with them today.   Objective: Vital signs in last 24 hours: Temp:  [97.4 F (36.3 C)-98.2 F (36.8 C)] 98.1 F (36.7 C) (06/11 0537) Pulse Rate:  [71-88] 84 (06/11 0537) Resp:  [11-20] 16 (06/11 0537) BP: (82-120)/(50-76) 100/54 (06/11 0537) SpO2:  [95 %-100 %] 95 % (06/11 0537) Weight:  [52.6 kg] 52.6 kg (06/10 1130)  Intake/Output from previous day:  Intake/Output Summary (Last 24 hours) at 12/10/2018 0825 Last data filed at 12/10/2018 0544 Gross per 24 hour  Intake 3964 ml  Output 2795 ml  Net 1169 ml    Labs: Recent Labs    12/10/18 0520  HGB 10.9*   Recent Labs    12/10/18 0520  WBC 14.1*  RBC 3.32*  HCT 32.7*  PLT 239   Recent Labs    12/10/18 0520  NA 137  K 4.1  CL 107  CO2 24  BUN 17  CREATININE 0.75  GLUCOSE 126*  CALCIUM 8.5*   Exam: General - Patient is Alert and Oriented Extremity - Neurologically intact Neurovascular intact Sensation intact distally Dorsiflexion/Plantar flexion intact Dressing - dressing C/D/I Motor Function - intact, moving foot and toes well on exam.   Past Medical History:  Diagnosis Date  . Arthritis   . Cancer [facility])    breast left and endometrial cancer  . Complication of anesthesia    during hysterectomy 2012 had to wake up took 3 hours and BP was low  . Personal history of chemotherapy    with endometrial cancer  . Pneumonia     Assessment/Plan: 1 Day Post-Op Procedure(s) (LRB): RIGHT TOTAL HIP ARTHROPLASTY ANTERIOR APPROACH (Right) Principal Problem:   OA (osteoarthritis) of hip   Estimated body mass index is 22.65 kg/m as calculated from the following:   Height as of this encounter: 5' (1.524 m).   Weight as of this encounter: 52.6 kg. Advance diet Up with therapy D/C IV fluids  DVT Prophylaxis - Xarelto Weight bearing as tolerated. D/C O2 and pulse ox and try on room air. Hemovac pulled without difficulty, will continue therapy.  Plan is to go Home after hospital stay. Plan for discharge this afternoon after two sessions of physical therapy as long as meeting goals. Follow-up in the office in 2 weeks.   Theresa Duty, PA-C Orthopedic Surgery 12/10/2018, 8:25 AM

## 2018-12-14 NOTE — Discharge Summary (Signed)
Physician Discharge Summary   Patient ID: Heather Rhodes MRN: [id number] DOB/AGE: 1929/08/23 84 y.o.  Admit date: 12/09/2018 Discharge date: 12/10/2018  Primary Diagnosis: Osteoarthritis, right hip   Admission Diagnoses:  Past Medical History:  Diagnosis Date  . Arthritis   . Cancer [facility])    breast left and endometrial cancer  . Complication of anesthesia    during hysterectomy 2012 had to wake up took 3 hours and BP was low  . Personal history of chemotherapy    with endometrial cancer  . Pneumonia    Discharge Diagnoses:   Principal Problem:   OA (osteoarthritis) of hip  Estimated body mass index is 22.65 kg/m as calculated from the following:   Height as of this encounter: 5' (1.524 m).   Weight as of this encounter: 52.6 kg.  Procedure:  Procedure(s) (LRB): RIGHT TOTAL HIP ARTHROPLASTY ANTERIOR APPROACH (Right)   Consults: None  HPI: Heather Rhodes is a 85 y.o. female who has advanced end-stage arthritis of their Right  hip with progressively worsening pain and dysfunction.The patient has failed nonoperative management and presents for total hip arthroplasty.   Laboratory Data: Admission on 12/09/2018, Discharged on 12/10/2018  Component Date Value Ref Range Status  . WBC 12/10/2018 14.1* 4.0 - 10.5 K/uL Final  . RBC 12/10/2018 3.32* 3.87 - 5.11 MIL/uL Final  . Hemoglobin 12/10/2018 10.9* 12.0 - 15.0 g/dL Final  . HCT 12/10/2018 32.7* 36.0 - 46.0 % Final  . MCV 12/10/2018 98.5  80.0 - 100.0 fL Final  . MCH 12/10/2018 32.8  26.0 - 34.0 pg Final  . MCHC 12/10/2018 33.3  30.0 - 36.0 g/dL Final  . RDW 12/10/2018 12.8  11.5 - 15.5 % Final  . Platelets 12/10/2018 239  150 - 400 K/uL Final  . nRBC 12/10/2018 0.0  0.0 - 0.2 % Final   Performed at [facility], [place] [address] [place], [place] [address]  . Sodium 12/10/2018 137  135 - 145 mmol/L Final  . Potassium 12/10/2018 4.1  3.5 - 5.1 mmol/L Final  . Chloride 12/10/2018 107  98 - 111 mmol/L  Final  . CO2 12/10/2018 24  22 - 32 mmol/L Final  . Glucose, Bld 12/10/2018 126* 70 - 99 mg/dL Final  . BUN 12/10/2018 17  8 - 23 mg/dL Final  . Creatinine, Ser 12/10/2018 0.75  0.44 - 1.00 mg/dL Final  . Calcium 12/10/2018 8.5* 8.9 - 10.3 mg/dL Final  . GFR calc non Af Amer 12/10/2018 >60  >60 mL/min Final  . GFR calc Af Amer 12/10/2018 >60  >60 mL/min Final  . Anion gap 12/10/2018 6  5 - 15 Final   Performed at [facility], [place] [address], [place], [place] [address]  Hospital Outpatient Visit on 12/07/2018  Component Date Value Ref Range Status  . SARS-CoV-2, NAA 12/07/2018 NOT DETECTED  NOT DETECTED Final   Comment: (NOTE) This test was developed and its performance characteristics determined by [employer]. This test has not been FDA cleared or approved. This test has been authorized by FDA under an Emergency Use Authorization (EUA). This test is only authorized for the duration of time the declaration that circumstances exist justifying the authorization of the emergency use of in vitro diagnostic tests for detection of SARS-CoV-2 virus and/or diagnosis of COVID-19 infection under section 564(b)(1) of the Act, 21 U.S.C. 170YFV-4(B)(4), unless the authorization is terminated or revoked sooner. When diagnostic testing is negative, the possibility of a false negative result should  be considered in the context of a patient's recent exposures and the presence of clinical signs and symptoms consistent with COVID-19. An individual without symptoms of COVID-19 and who is not shedding SARS-CoV-2 virus would expect to have a negative (not detected) result in this assay. Performed                           At: [facility] [place], [place] [id number] Rush Farmer MD PJ:[id number]   . Coronavirus Source 12/07/2018 NASOPHARYNGEAL   Final   Performed at [facility], [place] [address] [place], [place] [address]  Hospital Outpatient  Visit on 12/03/2018  Component Date Value Ref Range Status  . aPTT 12/03/2018 38* 24 - 36 seconds Final   Comment:        IF BASELINE aPTT IS ELEVATED, SUGGEST PATIENT RISK ASSESSMENT BE USED TO DETERMINE APPROPRIATE ANTICOAGULANT THERAPY. Performed at [facility], [place] [address], [place], [place] [address]   . WBC 12/03/2018 7.9  4.0 - 10.5 K/uL Final  . RBC 12/03/2018 4.17  3.87 - 5.11 MIL/uL Final  . Hemoglobin 12/03/2018 13.2  12.0 - 15.0 g/dL Final  . HCT 12/03/2018 41.0  36.0 - 46.0 % Final  . MCV 12/03/2018 98.3  80.0 - 100.0 fL Final  . MCH 12/03/2018 31.7  26.0 - 34.0 pg Final  . MCHC 12/03/2018 32.2  30.0 - 36.0 g/dL Final  . RDW 12/03/2018 12.6  11.5 - 15.5 % Final  . Platelets 12/03/2018 290  150 - 400 K/uL Final  . nRBC 12/03/2018 0.0  0.0 - 0.2 % Final   Performed at [facility], [place] [address] [place], [place] [address]  . Sodium 12/03/2018 140  135 - 145 mmol/L Final  . Potassium 12/03/2018 4.0  3.5 - 5.1 mmol/L Final  . Chloride 12/03/2018 106  98 - 111 mmol/L Final  . CO2 12/03/2018 28  22 - 32 mmol/L Final  . Glucose, Bld 12/03/2018 112* 70 - 99 mg/dL Final  . BUN 12/03/2018 26* 8 - 23 mg/dL Final  . Creatinine, Ser 12/03/2018 0.69  0.44 - 1.00 mg/dL Final  . Calcium 12/03/2018 9.5  8.9 - 10.3 mg/dL Final  . Total Protein 12/03/2018 7.4  6.5 - 8.1 g/dL Final  . Albumin 12/03/2018 4.3  3.5 - 5.0 g/dL Final  . AST 12/03/2018 23  15 - 41 U/L Final  . ALT 12/03/2018 18  0 - 44 U/L Final  . Alkaline Phosphatase 12/03/2018 63  38 - 126 U/L Final  . Total Bilirubin 12/03/2018 0.5  0.3 - 1.2 mg/dL Final  . GFR calc non Af Amer 12/03/2018 >60  >60 mL/min Final  . GFR calc Af Amer 12/03/2018 >60  >60 mL/min Final  . Anion gap 12/03/2018 6  5 - 15 Final   Performed at [facility], [place] [address] [place], [place] [address]  . Prothrombin Time 12/03/2018 12.1  11.4 - 15.2 seconds Final  . INR 12/03/2018 0.9   0.8 - 1.2 Final   Comment: (NOTE) INR goal varies based on device and disease states. Performed at [facility], [place] [address] [place], [place] [address]   . ABO/RH(D) 12/03/2018 A POS   Final  . Antibody Screen 12/03/2018 NEG   Final  . Sample Expiration 12/03/2018    Final  Value:12/12/2018,2359 Performed at [facility], [place] [address], [place], [place] [address]   . MRSA, PCR 12/03/2018 NEGATIVE  NEGATIVE Final  . Staphylococcus aureus 12/03/2018 NEGATIVE  NEGATIVE Final   Comment: (NOTE) The Xpert SA Assay (FDA approved for NASAL specimens in patients 64 years of age and older), is one component of a comprehensive surveillance program. It is not intended to diagnose infection nor to guide or monitor treatment. Performed at [facility], [place] [address], [place], [place] [address]      X-Rays:Dg Pelvis Portable  Result Date: 12/09/2018 CLINICAL DATA:  Status post right hip arthroplasty. EXAM: PORTABLE PELVIS 1-2 VIEWS COMPARISON:  Fluoroscopic images of same day. FINDINGS: The right femoral and acetabular components appear to be well situated. Expected postoperative changes are seen involving the surrounding soft tissues. Severe degenerative joint disease of the left hip is noted as well. IMPRESSION: Status post right total hip arthroplasty. Electronically Signed   By: Marijo Conception M.D.   On: 12/09/2018 15:21   Dg C-arm 1-60 Min-no Report  Result Date: 12/09/2018 Fluoroscopy was utilized by the requesting physician.  No radiographic interpretation.   Dg Hip Operative Unilat With Pelvis Right  Result Date: 12/09/2018 CLINICAL DATA:  Status post right hip replacement. FLUOROSCOPY TIME:  17 seconds. Images: 3 EXAM: OPERATIVE RIGHT HIP (WITH PELVIS IF PERFORMED) 3 VIEWS TECHNIQUE: Fluoroscopic spot image(s) were submitted for interpretation post-operatively. COMPARISON:  None. FINDINGS: The patient is  status post right hip replacement. Hardware is in good position. IMPRESSION: Right hip replacement as above. Electronically Signed   By: Dorise Bullion III M.D   On: 12/09/2018 14:49    EKG: Orders placed or performed during the hospital encounter of 12/03/18  . EKG 12 lead  . EKG 12 lead  . EKG  . EKG     Hospital Course: Heather Rhodes is a 85 y.o. who was admitted to [facility]. They were brought to the operating room on 12/09/2018 and underwent Procedure(s): RIGHT TOTAL HIP ARTHROPLASTY ANTERIOR APPROACH.  Patient tolerated the procedure well and was later transferred to the recovery room and then to the orthopaedic floor for postoperative care. They were given PO and IV analgesics for pain control following their surgery. They were given 24 hours of postoperative antibiotics of  Anti-infectives (From admission, onward)   Start     Dose/Rate Route Frequency Ordered Stop   12/09/18 1900  ceFAZolin (ANCEF) IVPB 1 g/50 mL premix     1 g 100 mL/hr over 30 Minutes Intravenous Every 6 hours 12/09/18 1559 12/10/18 0044   12/09/18 1115  ceFAZolin (ANCEF) IVPB 2g/100 mL premix     2 g 200 mL/hr over 30 Minutes Intravenous On call to O.R. 12/09/18 1105 12/09/18 1228     and started on DVT prophylaxis in the form of Xarelto.   PT and OT were ordered for total joint protocol. Discharge planning consulted to help with postop disposition and equipment needs.  Patient had a good night on the evening of surgery. They started to get up OOB with therapy on POD #0. Pt was seen during rounds and was ready to go home pending progress with therapy. Hemovac drain was pulled without difficulty. She worked with therapy on POD #1 and was meeting her goals. Pt was discharged to home later that day in stable condition.  Diet: Regular diet Activity: WBAT Follow-up: in 2 weeks Disposition: Home with HEP Discharged Condition: stable   Discharge Instructions  Call MD / Call 911   Complete by: As  directed    If you experience chest pain or shortness of breath, CALL 911 and be transported to the hospital emergency room.  If you develope a fever above 101 F, pus (white drainage) or increased drainage or redness at the wound, or calf pain, call your surgeon's office.   Change dressing   Complete by: As directed    You may change your dressing on Friday, then change the dressing daily with sterile 4 x 4 inch gauze dressing and paper tape.   Constipation Prevention   Complete by: As directed    Drink plenty of fluids.  Prune juice may be helpful.  You may use a stool softener, such as Colace (over the counter) 100 mg twice a day.  Use MiraLax (over the counter) for constipation as needed.   Diet - low sodium heart healthy   Complete by: As directed    Discharge instructions   Complete by: As directed    Dr. Gaynelle Arabian Total Joint Specialist [facility] [address], [place], [place] [address] [phone]  ANTERIOR APPROACH TOTAL HIP REPLACEMENT POSTOPERATIVE DIRECTIONS   Hip Rehabilitation, Guidelines Following Surgery  The results of a hip operation are greatly improved after range of motion and muscle strengthening exercises. Follow all safety measures which are given to protect your hip. If any of these exercises cause increased pain or swelling in your joint, decrease the amount until you are comfortable again. Then slowly increase the exercises. Call your caregiver if you have problems or questions.   HOME CARE INSTRUCTIONS  Remove items at home which could result in a fall. This includes throw rugs or furniture in walking pathways.  ICE to the affected hip every three hours for 30 minutes at a time and then as needed for pain and swelling.  Continue to use ice on the hip for pain and swelling from surgery. You may notice swelling that will progress down to the foot and ankle.  This is normal after surgery.  Elevate the leg when you are not up walking on it.    Continue to use the breathing machine which will help keep your temperature down.  It is common for your temperature to cycle up and down following surgery, especially at night when you are not up moving around and exerting yourself.  The breathing machine keeps your lungs expanded and your temperature down.  DIET You may resume your previous home diet once your are discharged from the hospital.  DRESSING / WOUND CARE / SHOWERING You may change your dressing 3-5 days after surgery.  Then change the dressing every day with sterile gauze.  Please use good hand washing techniques before changing the dressing.  Do not use any lotions or creams on the incision until instructed by your surgeon. You may start showering once you are discharged home but do not submerge the incision under water. Just pat the incision dry and apply a dry gauze dressing on daily. Change the surgical dressing daily and reapply a dry dressing each time.  ACTIVITY Walk with your walker as instructed. Use walker as long as suggested by your caregivers. Avoid periods of inactivity such as sitting longer than an hour when not asleep. This helps prevent blood clots.  You may resume a sexual relationship in one month or when given the OK by your doctor.  You may return to work once you are cleared by your doctor.  Do not drive a car for 6 weeks or until released by you surgeon.  Do not drive while taking narcotics.  WEIGHT BEARING Weight bearing as tolerated with assist device (walker, cane, etc) as directed, use it as long as suggested by your surgeon or therapist, typically at least 4-6 weeks.  POSTOPERATIVE CONSTIPATION PROTOCOL Constipation - defined medically as fewer than three stools per week and severe constipation as less than one stool per week.  One of the most common issues patients have following surgery is constipation.  Even if you have a regular bowel pattern at home, your normal regimen is likely to be  disrupted due to multiple reasons following surgery.  Combination of anesthesia, postoperative narcotics, change in appetite and fluid intake all can affect your bowels.  In order to avoid complications following surgery, here are some recommendations in order to help you during your recovery period.  Colace (docusate) - Pick up an over-the-counter form of Colace or another stool softener and take twice a day as long as you are requiring postoperative pain medications.  Take with a full glass of water daily.  If you experience loose stools or diarrhea, hold the colace until you stool forms back up.  If your symptoms do not get better within 1 week or if they get worse, check with your doctor.  Dulcolax (bisacodyl) - Pick up over-the-counter and take as directed by the product packaging as needed to assist with the movement of your bowels.  Take with a full glass of water.  Use this product as needed if not relieved by Colace only.   MiraLax (polyethylene glycol) - Pick up over-the-counter to have on hand.  MiraLax is a solution that will increase the amount of water in your bowels to assist with bowel movements.  Take as directed and can mix with a glass of water, juice, soda, coffee, or tea.  Take if you go more than two days without a movement. Do not use MiraLax more than once per day. Call your doctor if you are still constipated or irregular after using this medication for 7 days in a row.  If you continue to have problems with postoperative constipation, please contact the office for further assistance and recommendations.  If you experience "the worst abdominal pain ever" or develop nausea or vomiting, please contact the office immediatly for further recommendations for treatment.  ITCHING  If you experience itching with your medications, try taking only a single pain pill, or even half a pain pill at a time.  You can also use Benadryl over the counter for itching or also to help with sleep.    TED HOSE STOCKINGS Wear the elastic stockings on both legs for three weeks following surgery during the day but you may remove then at night for sleeping.  MEDICATIONS See your medication summary on the "After Visit Summary" that the nursing staff will review with you prior to discharge.  You may have some home medications which will be placed on hold until you complete the course of blood thinner medication.  It is important for you to complete the blood thinner medication as prescribed by your surgeon.  Continue your approved medications as instructed at time of discharge.  PRECAUTIONS If you experience chest pain or shortness of breath - call 911 immediately for transfer to the hospital emergency department.  If you develop a fever greater that 101 F, purulent drainage from wound, increased redness or drainage from wound, foul odor from the wound/dressing,  or calf pain - CONTACT YOUR SURGEON.                                                   FOLLOW-UP APPOINTMENTS Make sure you keep all of your appointments after your operation with your surgeon and caregivers. You should call the office at the above phone number and make an appointment for approximately two weeks after the date of your surgery or on the date instructed by your surgeon outlined in the "After Visit Summary".  RANGE OF MOTION AND STRENGTHENING EXERCISES  These exercises are designed to help you keep full movement of your hip joint. Follow your caregiver's or physical therapist's instructions. Perform all exercises about fifteen times, three times per day or as directed. Exercise both hips, even if you have had only one joint replacement. These exercises can be done on a training (exercise) mat, on the floor, on a table or on a bed. Use whatever works the best and is most comfortable for you. Use music or television while you are exercising so that the exercises are a pleasant break in your day. This will make your life better with  the exercises acting as a break in routine you can look forward to.  Lying on your back, slowly slide your foot toward your buttocks, raising your knee up off the floor. Then slowly slide your foot back down until your leg is straight again.  Lying on your back spread your legs as far apart as you can without causing discomfort.  Lying on your side, raise your upper leg and foot straight up from the floor as far as is comfortable. Slowly lower the leg and repeat.  Lying on your back, tighten up the muscle in the front of your thigh (quadriceps muscles). You can do this by keeping your leg straight and trying to raise your heel off the floor. This helps strengthen the largest muscle supporting your knee.  Lying on your back, tighten up the muscles of your buttocks both with the legs straight and with the knee bent at a comfortable angle while keeping your heel on the floor.   IF YOU ARE TRANSFERRED TO A SKILLED REHAB FACILITY If the patient is transferred to a skilled rehab facility following release from the hospital, a list of the current medications will be sent to the facility for the patient to continue.  When discharged from the skilled rehab facility, please have the facility set up the patient's [place] prior to being released. Also, the skilled facility will be responsible for providing the patient with their medications at time of release from the facility to include their pain medication, the muscle relaxants, and their blood thinner medication. If the patient is still at the rehab facility at time of the two week follow up appointment, the skilled rehab facility will also need to assist the patient in arranging follow up appointment in our office and any transportation needs.  MAKE SURE YOU:  Understand these instructions.  Get help right away if you are not doing well or get worse.    Pick up stool softner and laxative for home use following surgery while on pain  medications. Do not submerge incision under water. Please use good hand washing techniques while changing dressing each day. May shower starting three days after surgery. Please use  a clean towel to pat the incision dry following showers. Continue to use ice for pain and swelling after surgery. Do not use any lotions or creams on the incision until instructed by your surgeon.   Do not sit on low chairs, stoools or toilet seats, as it may be difficult to get up from low surfaces   Complete by: As directed    Driving restrictions   Complete by: As directed    No driving for two weeks   TED hose   Complete by: As directed    Use stockings (TED hose) for three weeks on both leg(s).  You may remove them at night for sleeping.   Weight bearing as tolerated   Complete by: As directed      Allergies as of 12/10/2018   No Known Allergies     Medication List    STOP taking these medications   ALFALFA PO   Arnicare Gel   ibuprofen 200 MG tablet Commonly known as: ADVIL   OVER THE COUNTER MEDICATION     TAKE these medications   HYDROcodone-acetaminophen 5-325 MG tablet Commonly known as: NORCO/VICODIN Take 1-2 tablets by mouth every 6 (six) hours as needed for severe pain.   methocarbamol 500 MG tablet Commonly known as: ROBAXIN Take 1 tablet (500 mg total) by mouth every 6 (six) hours as needed for muscle spasms.   rivaroxaban 10 MG Tabs tablet Commonly known as: XARELTO Take 1 tablet (10 mg total) by mouth daily with breakfast for 20 days. Then take one 81 mg aspirin once a day for three weeks. Then discontinue aspirin.   SYSTANE OP Place 1 drop into both eyes 3 (three) times daily.   traMADol 50 MG tablet Commonly known as: ULTRAM Take 1-2 tablets (50-100 mg total) by mouth every 6 (six) hours as needed for moderate pain.            Discharge Care Instructions  (From admission, onward)         Start     Ordered   12/10/18 0000  Weight bearing as tolerated      12/10/18 0829   12/10/18 0000  Change dressing    Comments: You may change your dressing on Friday, then change the dressing daily with sterile 4 x 4 inch gauze dressing and paper tape.   12/10/18 0829         Follow-up Information    Gaynelle Arabian, MD. Schedule an appointment as soon as possible for a visit on 12/22/2018.   Specialty: Orthopedic Surgery Contact information: [address] [place] [address] [phone]           Signed: Theresa Duty, PA-C Orthopedic Surgery 12/14/2018, 7:49 AM

## 2018-12-15 ENCOUNTER — Ambulatory Visit (HOSPITAL_COMMUNITY)
Admission: RE | Admit: 2018-12-15 | Discharge: 2018-12-15 | Disposition: A | Discharge status: Home / Self Care | Payer: Medicare HMO | Source: Ambulatory Visit | Attending: Cardiovascular Disease | Admitting: Cardiovascular Disease

## 2018-12-15 ENCOUNTER — Other Ambulatory Visit (HOSPITAL_COMMUNITY): Payer: Self-pay | Admitting: Orthopedic Surgery

## 2018-12-15 ENCOUNTER — Other Ambulatory Visit: Payer: Self-pay

## 2018-12-15 DIAGNOSIS — M7989 Other specified soft tissue disorders: Secondary | ICD-10-CM

## 2018-12-15 DIAGNOSIS — M79661 Pain in right lower leg: Secondary | ICD-10-CM | POA: Diagnosis present

## 2018-12-21 DIAGNOSIS — Z96641 Presence of right artificial hip joint: Secondary | ICD-10-CM | POA: Insufficient documentation

## 2019-07-29 ENCOUNTER — Ambulatory Visit: Payer: Medicare HMO

## 2019-07-30 ENCOUNTER — Telehealth: Payer: Self-pay | Admitting: Internal Medicine

## 2019-07-30 NOTE — Telephone Encounter (Signed)
Patient is calling back regarding COVID 19 waitlist. She has received her vaccine from another provider.  She no longer needs an appointmern

## 2019-11-02 DIAGNOSIS — M25571 Pain in right ankle and joints of right foot: Secondary | ICD-10-CM | POA: Insufficient documentation

## 2019-12-06 ENCOUNTER — Ambulatory Visit (INDEPENDENT_AMBULATORY_CARE_PROVIDER_SITE_OTHER): Payer: Medicare HMO

## 2019-12-06 ENCOUNTER — Ambulatory Visit: Payer: Medicare HMO | Admitting: Podiatry

## 2019-12-06 ENCOUNTER — Encounter: Payer: Self-pay | Admitting: Podiatry

## 2019-12-06 ENCOUNTER — Other Ambulatory Visit: Payer: Self-pay

## 2019-12-06 VITALS — BP 134/74 | HR 93 | Temp 97.4°F | Resp 16

## 2019-12-06 DIAGNOSIS — M779 Enthesopathy, unspecified: Secondary | ICD-10-CM | POA: Diagnosis not present

## 2019-12-06 DIAGNOSIS — M25571 Pain in right ankle and joints of right foot: Secondary | ICD-10-CM | POA: Diagnosis not present

## 2019-12-06 DIAGNOSIS — M25572 Pain in left ankle and joints of left foot: Secondary | ICD-10-CM | POA: Diagnosis not present

## 2019-12-06 NOTE — Progress Notes (Signed)
   Subjective:    Patient ID: Heather Rhodes, female    DOB: 01/20/1930, 86 y.o.   MRN: [id number]  HPI    Review of Systems  All other systems reviewed and are negative.      Objective:   Physical Exam        Assessment & Plan:

## 2019-12-06 NOTE — Progress Notes (Signed)
Subjective:   Patient ID: Heather Rhodes, female   DOB: 86 y.o.   MRN: [id number]   HPI Patient presents stating the outside of my right ankle has really been bothering me and I do not remember specific injury.  States is been this way for around 3 weeks and patient likes to be active if possible and does not smoke currently   Review of Systems  All other systems reviewed and are negative.       Objective:  Physical Exam Vitals and nursing note reviewed.  Constitutional:      Appearance: She is well-developed.  Pulmonary:     Effort: Pulmonary effort is normal.  Musculoskeletal:        General: Normal range of motion.  Skin:    General: Skin is warm.  Neurological:     Mental Status: She is alert.     Neurovascular status intact muscle strength found to be adequate range of motion within normal limits.  Patient is noted to have quite a bit of pain in the sinus tarsi right with inflammation fluid within this area and is found to have good digital perfusion well oriented x3 with mild range of motion loss secondary to the pain she is experiencing     Assessment:  Acute capsulitis of the sinus tarsi right     Plan:  H&P reviewed condition and x-ray today I did sterile prep and injected the sinus tarsi right 3 mg Kenalog 5 mg Xylocaine and advised on reduced activity in the brace that she has been wearing.  Gave instructions for gradual increase in activity over the next couple weeks  X-ray indicates that there is moderate arthritis osteoporosis but no indication of fracture or advanced condition

## 2019-12-07 ENCOUNTER — Other Ambulatory Visit: Payer: Self-pay | Admitting: Podiatry

## 2019-12-07 DIAGNOSIS — M779 Enthesopathy, unspecified: Secondary | ICD-10-CM

## 2019-12-16 ENCOUNTER — Other Ambulatory Visit: Payer: Self-pay

## 2019-12-16 ENCOUNTER — Ambulatory Visit (INDEPENDENT_AMBULATORY_CARE_PROVIDER_SITE_OTHER): Payer: Medicare HMO | Admitting: Orthotics

## 2019-12-16 DIAGNOSIS — M25572 Pain in left ankle and joints of left foot: Secondary | ICD-10-CM

## 2019-12-16 DIAGNOSIS — M779 Enthesopathy, unspecified: Secondary | ICD-10-CM

## 2019-12-16 DIAGNOSIS — M2170 Unequal limb length (acquired), unspecified site: Secondary | ICD-10-CM

## 2019-12-16 DIAGNOSIS — M25571 Pain in right ankle and joints of right foot: Secondary | ICD-10-CM | POA: Diagnosis not present

## 2019-12-16 DIAGNOSIS — M21721 Unequal limb length (acquired), right humerus: Secondary | ICD-10-CM

## 2019-12-16 NOTE — Progress Notes (Signed)
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  Signed ABN

## 2019-12-17 ENCOUNTER — Telehealth: Payer: Self-pay | Admitting: Podiatry

## 2019-12-17 NOTE — Telephone Encounter (Signed)
Pt called and stated she talked to her insurance([employer]) and they told her it would be covered if it was coded correctly since pt does have leg length discrepancy. Could we please add to pts chart and I will get billing dept to add to claim.

## 2020-01-06 ENCOUNTER — Encounter: Payer: Medicare HMO | Admitting: Orthotics

## 2020-01-06 ENCOUNTER — Other Ambulatory Visit: Payer: Self-pay

## 2020-04-04 IMAGING — DX PORTABLE PELVIS 1-2 VIEWS
1 series · 1 of 1 positions shown · non-contrast
Comparison: Fluoroscopic images of same day.

CLINICAL DATA: Status post right hip arthroplasty.

EXAM:
PORTABLE PELVIS 1-2 VIEWS

[pelvis ap]
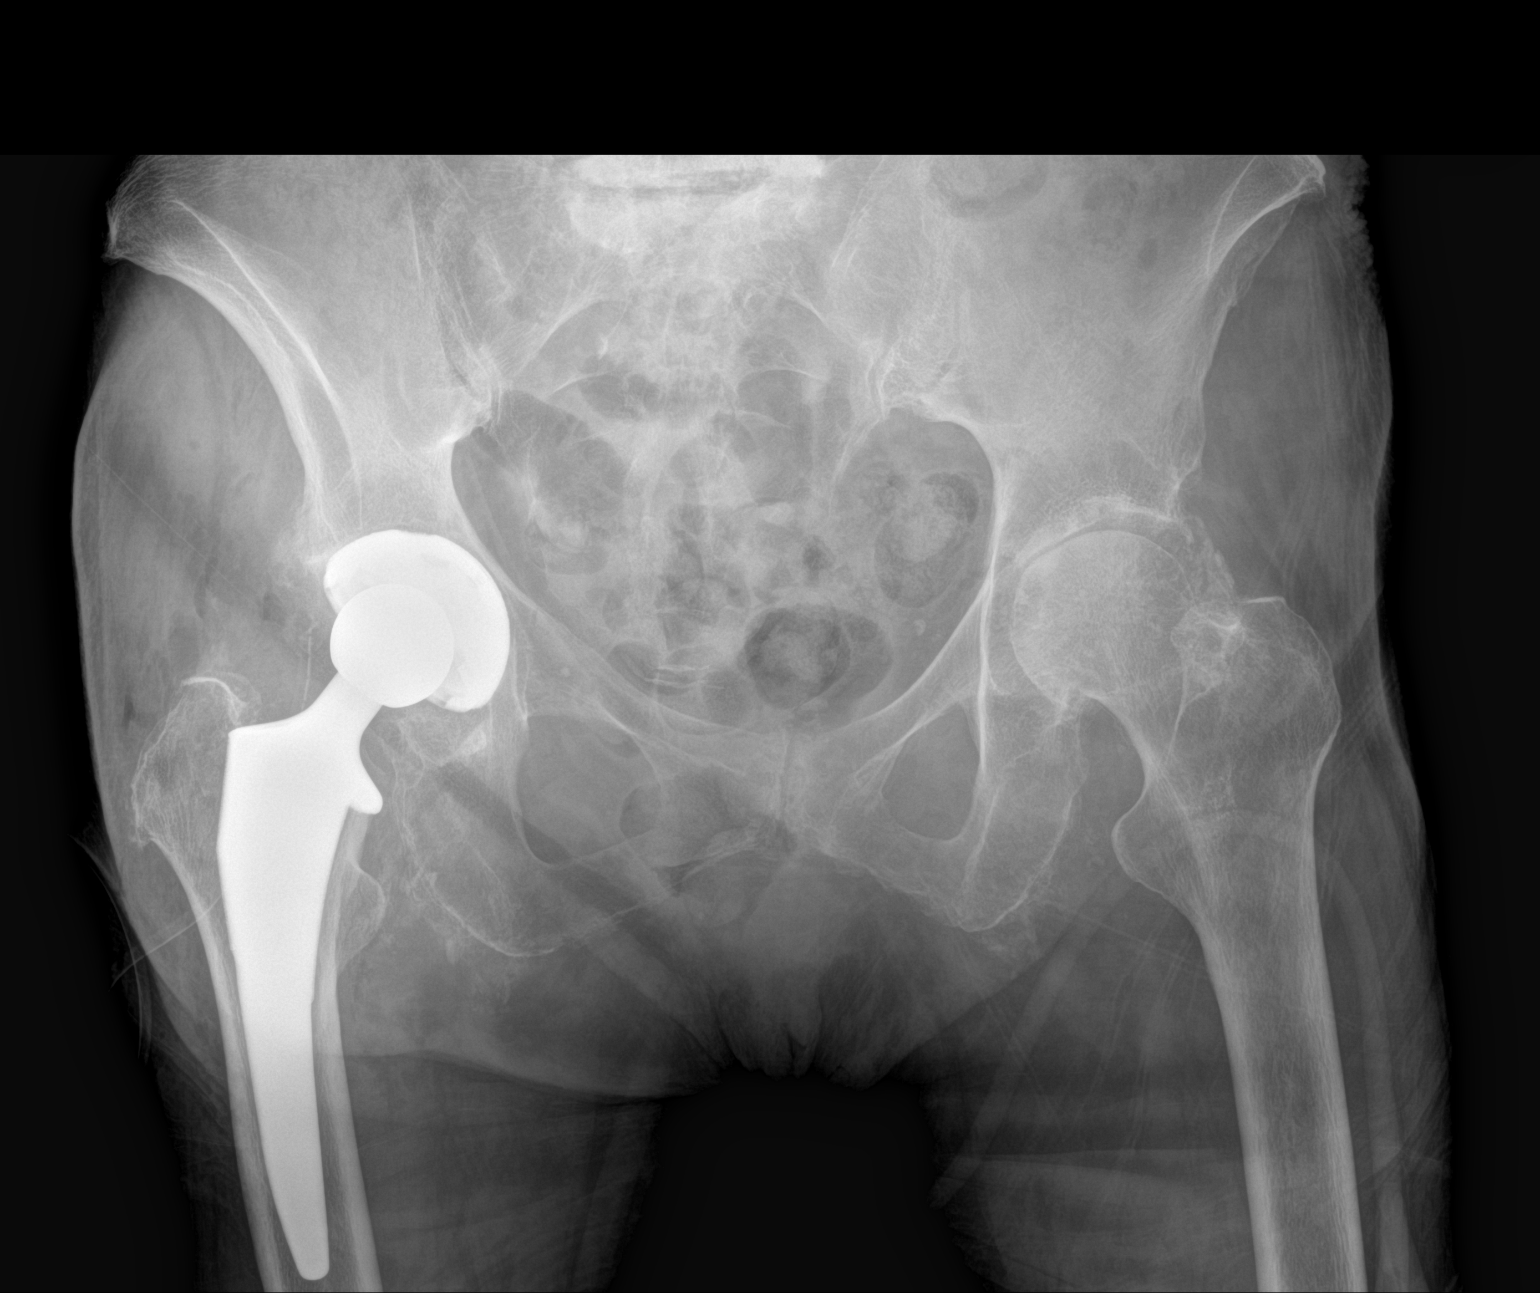

[1 of 1 positions shown; findings below may reference images not displayed]

FINDINGS: The right femoral and acetabular components appear to be well
situated. Expected postoperative changes are seen involving the
surrounding soft tissues. Severe degenerative joint disease of the
left hip is noted as well.
IMPRESSION: Status post right total hip arthroplasty.

## 2020-08-28 ENCOUNTER — Other Ambulatory Visit (HOSPITAL_COMMUNITY): Payer: Self-pay | Admitting: Family Medicine

## 2020-08-28 ENCOUNTER — Other Ambulatory Visit: Payer: Self-pay | Admitting: Family Medicine

## 2020-08-28 DIAGNOSIS — Z13820 Encounter for screening for osteoporosis: Secondary | ICD-10-CM

## 2020-08-28 DIAGNOSIS — R0789 Other chest pain: Secondary | ICD-10-CM

## 2020-08-28 DIAGNOSIS — Z1231 Encounter for screening mammogram for malignant neoplasm of breast: Secondary | ICD-10-CM

## 2020-09-25 ENCOUNTER — Other Ambulatory Visit: Payer: Self-pay

## 2020-09-25 ENCOUNTER — Ambulatory Visit (HOSPITAL_COMMUNITY): Payer: Medicare HMO | Attending: Internal Medicine

## 2020-09-25 DIAGNOSIS — R0789 Other chest pain: Secondary | ICD-10-CM | POA: Insufficient documentation

## 2020-09-25 LAB — ECHOCARDIOGRAM COMPLETE
Area-P 1/2: 2.73 cm2
S' Lateral: 1.6 cm

## 2021-02-07 ENCOUNTER — Other Ambulatory Visit: Payer: Medicare HMO

## 2021-04-10 ENCOUNTER — Other Ambulatory Visit: Payer: Self-pay

## 2021-04-10 ENCOUNTER — Ambulatory Visit
Admission: RE | Admit: 2021-04-10 | Discharge: 2021-04-10 | Disposition: A | Discharge status: Home / Self Care | Payer: Medicare HMO | Source: Ambulatory Visit | Attending: Family Medicine | Admitting: Family Medicine

## 2021-04-10 DIAGNOSIS — Z1231 Encounter for screening mammogram for malignant neoplasm of breast: Secondary | ICD-10-CM

## 2021-04-11 ENCOUNTER — Ambulatory Visit: Payer: Medicare HMO | Admitting: Dermatology

## 2021-05-28 ENCOUNTER — Ambulatory Visit: Payer: Medicare HMO | Admitting: Dermatology

## 2021-05-28 ENCOUNTER — Other Ambulatory Visit: Payer: Self-pay

## 2021-05-28 ENCOUNTER — Encounter: Payer: Self-pay | Admitting: Dermatology

## 2021-05-28 ENCOUNTER — Other Ambulatory Visit: Payer: Self-pay | Admitting: Dermatology

## 2021-05-28 DIAGNOSIS — D485 Neoplasm of uncertain behavior of skin: Secondary | ICD-10-CM

## 2021-05-28 DIAGNOSIS — I878 Other specified disorders of veins: Secondary | ICD-10-CM

## 2021-05-28 DIAGNOSIS — L820 Inflamed seborrheic keratosis: Secondary | ICD-10-CM | POA: Diagnosis not present

## 2021-05-28 DIAGNOSIS — L570 Actinic keratosis: Secondary | ICD-10-CM

## 2021-05-28 DIAGNOSIS — Z1283 Encounter for screening for malignant neoplasm of skin: Secondary | ICD-10-CM

## 2021-05-28 NOTE — Patient Instructions (Signed)

## 2021-06-15 ENCOUNTER — Encounter: Payer: Self-pay | Admitting: Dermatology

## 2021-06-15 NOTE — Progress Notes (Signed)
° °  New Patient   Subjective  Heather Rhodes is a 87 y.o. female who presents for the following: Annual Exam (Right post thigh no size change just wants it checked, facial crust she is unsure what it is she stated she pick's it. Patient said she has history of right hand squamous biopsy ).  Growth on back of right thigh, small spot on face, rash on leg Location:  Duration:  Quality:  Associated Signs/Symptoms: Modifying Factors:  Severity:  Timing: Context:    The following portions of the chart were reviewed this encounter and updated as appropriate:  Tobacco   Allergies   Meds   Problems   Med Hx   Surg Hx   Fam Hx       Objective  Well appearing patient in no apparent distress; mood and affect are within normal limits. Torso - Posterior (Back) Full body skin check: No atypical pigmented lesions  Right Thigh - Posterior 8 mm slightly inflamed pink crust, CIS versus I-S K     Head - Anterior (Face) Two millimeter gritty pink crust.  Right Leg Hemosiderosis and slight edema without significant active inflammation    A full examination was performed including scalp, head, eyes, ears, nose, lips, neck, chest, axillae, abdomen, back, buttocks, bilateral upper extremities, bilateral lower extremities, hands, feet, fingers, toes, fingernails, and toenails. All findings within normal limits unless otherwise noted below.  Areas beneath undergarments not fully examined.   Assessment & Plan  Screening exam for skin cancer Torso - Posterior (Back)  Annual skin examination  Neoplasm of uncertain behavior of skin Right Thigh - Posterior  Skin / nail biopsy Type of biopsy: tangential   Informed consent: discussed and consent obtained   Timeout: patient name, date of birth, surgical site, and procedure verified   Anesthesia: the lesion was anesthetized in a standard fashion   Anesthetic:  1% lidocaine w/ epinephrine 1-100,000 local infiltration Instrument used: flexible  razor blade   Hemostasis achieved with: aluminum chloride and electrodesiccation   Outcome: patient tolerated procedure well   Post-procedure details: wound care instructions given    Destruction of lesion Complexity: simple   Destruction method: electrodesiccation and curettage   Informed consent: discussed and consent obtained   Timeout:  patient name, date of birth, surgical site, and procedure verified Anesthesia: the lesion was anesthetized in a standard fashion   Anesthetic:  1% lidocaine w/ epinephrine 1-100,000 local infiltration Curettage performed in three different directions: Yes   Electrodesiccation performed over the curetted area: Yes   Curettage cycles:  3 Margin per side (cm):  0.1 Final wound size (cm):  1 Hemostasis achieved with:  aluminum chloride Outcome: patient tolerated procedure well with no complications   Post-procedure details: wound care instructions given    Specimen 1 - Surgical pathology Differential Diagnosis: isk bcc scc tx with bx  Check Margins: No  AK (actinic keratosis) Head - Anterior (Face)  Lesion is not bothersome to patient and historically stable.  Will return if there is clinical change.  Venous stasis Right Leg  Discussed ways to minimize lower extremity venous pressure.  To contact me if the rash becomes symptomatic or gets worse

## 2021-07-27 ENCOUNTER — Ambulatory Visit
Admission: RE | Admit: 2021-07-27 | Discharge: 2021-07-27 | Disposition: A | Discharge status: Home / Self Care | Payer: Medicare HMO | Source: Ambulatory Visit | Attending: Family Medicine | Admitting: Family Medicine

## 2021-07-27 DIAGNOSIS — Z13820 Encounter for screening for osteoporosis: Secondary | ICD-10-CM

## 2021-10-04 ENCOUNTER — Ambulatory Visit
Admission: RE | Admit: 2021-10-04 | Discharge: 2021-10-04 | Disposition: A | Discharge status: Home / Self Care | Payer: Medicare HMO | Source: Ambulatory Visit | Attending: Family Medicine | Admitting: Family Medicine

## 2021-10-04 ENCOUNTER — Other Ambulatory Visit: Payer: Self-pay | Admitting: Family Medicine

## 2021-10-04 DIAGNOSIS — M25571 Pain in right ankle and joints of right foot: Secondary | ICD-10-CM | POA: Diagnosis not present

## 2021-10-22 ENCOUNTER — Encounter: Payer: Self-pay | Admitting: Podiatry

## 2021-10-22 ENCOUNTER — Ambulatory Visit (INDEPENDENT_AMBULATORY_CARE_PROVIDER_SITE_OTHER): Payer: Medicare HMO

## 2021-10-22 ENCOUNTER — Ambulatory Visit: Payer: Medicare HMO | Admitting: Podiatry

## 2021-10-22 DIAGNOSIS — M7751 Other enthesopathy of right foot: Secondary | ICD-10-CM

## 2021-10-22 DIAGNOSIS — M779 Enthesopathy, unspecified: Secondary | ICD-10-CM

## 2021-10-22 DIAGNOSIS — M25572 Pain in left ankle and joints of left foot: Secondary | ICD-10-CM

## 2021-10-22 DIAGNOSIS — M25571 Pain in right ankle and joints of right foot: Secondary | ICD-10-CM

## 2021-10-22 MED ORDER — TRIAMCINOLONE ACETONIDE 10 MG/ML IJ SUSP
10.0000 mg | Freq: Once | INTRAMUSCULAR | Status: AC
  Administered 2021-10-22: 10 mg

## 2021-10-24 NOTE — Progress Notes (Signed)
Subjective:  ? ?Patient ID: Heather Rhodes, female   DOB: 88 y.o.   MRN: [id number]  ? ?HPI ?Patient presents with chronic pains in the ankles of both feet stating that medication helps her and it is just started back again ? ? ?ROS ? ? ?   ?Objective:  ?Physical Exam  ?Neurovascular status intact inflammation pain around room sinus tarsi bilateral with fluid buildup in the joints ? ?   ?Assessment:  ?Acute capsulitis bilateral ? ?   ?Plan:  ?Standard prep and went ahead injected the capsule 3 mg Kenalog 5 mg Xylocaine advised on reduced activity reappoint to recheck ? ?X-rays indicated arthritis did not indicate stress fracture or significant changes from previous visit ?   ? ? ?

## 2021-10-29 ENCOUNTER — Other Ambulatory Visit: Payer: Self-pay | Admitting: Podiatry

## 2021-10-29 DIAGNOSIS — M779 Enthesopathy, unspecified: Secondary | ICD-10-CM

## 2021-11-22 DIAGNOSIS — H6122 Impacted cerumen, left ear: Secondary | ICD-10-CM | POA: Diagnosis not present

## 2021-11-22 DIAGNOSIS — Z0000 Encounter for general adult medical examination without abnormal findings: Secondary | ICD-10-CM | POA: Diagnosis not present

## 2021-11-22 DIAGNOSIS — M48061 Spinal stenosis, lumbar region without neurogenic claudication: Secondary | ICD-10-CM | POA: Diagnosis not present

## 2021-11-22 DIAGNOSIS — R0789 Other chest pain: Secondary | ICD-10-CM | POA: Diagnosis not present

## 2021-11-22 DIAGNOSIS — N3281 Overactive bladder: Secondary | ICD-10-CM | POA: Diagnosis not present

## 2021-11-22 DIAGNOSIS — U071 COVID-19: Secondary | ICD-10-CM | POA: Diagnosis not present

## 2021-11-22 DIAGNOSIS — Z8542 Personal history of malignant neoplasm of other parts of uterus: Secondary | ICD-10-CM | POA: Diagnosis not present

## 2021-11-22 DIAGNOSIS — I878 Other specified disorders of veins: Secondary | ICD-10-CM | POA: Diagnosis not present

## 2021-11-22 DIAGNOSIS — S39011A Strain of muscle, fascia and tendon of abdomen, initial encounter: Secondary | ICD-10-CM | POA: Diagnosis not present

## 2021-11-22 DIAGNOSIS — M810 Age-related osteoporosis without current pathological fracture: Secondary | ICD-10-CM | POA: Diagnosis not present

## 2021-11-22 DIAGNOSIS — L989 Disorder of the skin and subcutaneous tissue, unspecified: Secondary | ICD-10-CM | POA: Diagnosis not present

## 2021-11-22 DIAGNOSIS — D509 Iron deficiency anemia, unspecified: Secondary | ICD-10-CM | POA: Diagnosis not present

## 2021-11-22 DIAGNOSIS — M25571 Pain in right ankle and joints of right foot: Secondary | ICD-10-CM | POA: Diagnosis not present

## 2021-11-22 DIAGNOSIS — Z853 Personal history of malignant neoplasm of breast: Secondary | ICD-10-CM | POA: Diagnosis not present

## 2021-11-22 DIAGNOSIS — M19071 Primary osteoarthritis, right ankle and foot: Secondary | ICD-10-CM | POA: Diagnosis not present

## 2021-12-12 DIAGNOSIS — Z008 Encounter for other general examination: Secondary | ICD-10-CM | POA: Diagnosis not present

## 2022-01-10 DIAGNOSIS — M9901 Segmental and somatic dysfunction of cervical region: Secondary | ICD-10-CM | POA: Diagnosis not present

## 2022-01-10 DIAGNOSIS — M9905 Segmental and somatic dysfunction of pelvic region: Secondary | ICD-10-CM | POA: Diagnosis not present

## 2022-01-10 DIAGNOSIS — M9904 Segmental and somatic dysfunction of sacral region: Secondary | ICD-10-CM | POA: Diagnosis not present

## 2022-01-10 DIAGNOSIS — M9903 Segmental and somatic dysfunction of lumbar region: Secondary | ICD-10-CM | POA: Diagnosis not present

## 2022-01-28 DIAGNOSIS — R531 Weakness: Secondary | ICD-10-CM | POA: Diagnosis not present

## 2022-01-28 DIAGNOSIS — R262 Difficulty in walking, not elsewhere classified: Secondary | ICD-10-CM | POA: Diagnosis not present

## 2022-01-31 DIAGNOSIS — M9904 Segmental and somatic dysfunction of sacral region: Secondary | ICD-10-CM | POA: Diagnosis not present

## 2022-01-31 DIAGNOSIS — M9905 Segmental and somatic dysfunction of pelvic region: Secondary | ICD-10-CM | POA: Diagnosis not present

## 2022-01-31 DIAGNOSIS — M9901 Segmental and somatic dysfunction of cervical region: Secondary | ICD-10-CM | POA: Diagnosis not present

## 2022-01-31 DIAGNOSIS — M9903 Segmental and somatic dysfunction of lumbar region: Secondary | ICD-10-CM | POA: Diagnosis not present

## 2022-02-06 DIAGNOSIS — R262 Difficulty in walking, not elsewhere classified: Secondary | ICD-10-CM | POA: Diagnosis not present

## 2022-02-06 DIAGNOSIS — R531 Weakness: Secondary | ICD-10-CM | POA: Diagnosis not present

## 2022-02-08 DIAGNOSIS — R262 Difficulty in walking, not elsewhere classified: Secondary | ICD-10-CM | POA: Diagnosis not present

## 2022-02-08 DIAGNOSIS — R531 Weakness: Secondary | ICD-10-CM | POA: Diagnosis not present

## 2022-02-11 DIAGNOSIS — R531 Weakness: Secondary | ICD-10-CM | POA: Diagnosis not present

## 2022-02-11 DIAGNOSIS — R262 Difficulty in walking, not elsewhere classified: Secondary | ICD-10-CM | POA: Diagnosis not present

## 2022-02-13 DIAGNOSIS — R531 Weakness: Secondary | ICD-10-CM | POA: Diagnosis not present

## 2022-02-13 DIAGNOSIS — R262 Difficulty in walking, not elsewhere classified: Secondary | ICD-10-CM | POA: Diagnosis not present

## 2022-02-21 DIAGNOSIS — H04123 Dry eye syndrome of bilateral lacrimal glands: Secondary | ICD-10-CM | POA: Diagnosis not present

## 2022-02-21 DIAGNOSIS — H52203 Unspecified astigmatism, bilateral: Secondary | ICD-10-CM | POA: Diagnosis not present

## 2022-02-21 DIAGNOSIS — Z961 Presence of intraocular lens: Secondary | ICD-10-CM | POA: Diagnosis not present

## 2022-02-22 DIAGNOSIS — M9903 Segmental and somatic dysfunction of lumbar region: Secondary | ICD-10-CM | POA: Diagnosis not present

## 2022-02-22 DIAGNOSIS — M9905 Segmental and somatic dysfunction of pelvic region: Secondary | ICD-10-CM | POA: Diagnosis not present

## 2022-02-22 DIAGNOSIS — M9901 Segmental and somatic dysfunction of cervical region: Secondary | ICD-10-CM | POA: Diagnosis not present

## 2022-02-22 DIAGNOSIS — M9904 Segmental and somatic dysfunction of sacral region: Secondary | ICD-10-CM | POA: Diagnosis not present

## 2022-03-14 DIAGNOSIS — M9903 Segmental and somatic dysfunction of lumbar region: Secondary | ICD-10-CM | POA: Diagnosis not present

## 2022-03-14 DIAGNOSIS — M9904 Segmental and somatic dysfunction of sacral region: Secondary | ICD-10-CM | POA: Diagnosis not present

## 2022-03-14 DIAGNOSIS — M9905 Segmental and somatic dysfunction of pelvic region: Secondary | ICD-10-CM | POA: Diagnosis not present

## 2022-03-14 DIAGNOSIS — M9901 Segmental and somatic dysfunction of cervical region: Secondary | ICD-10-CM | POA: Diagnosis not present

## 2022-03-20 DIAGNOSIS — Z0100 Encounter for examination of eyes and vision without abnormal findings: Secondary | ICD-10-CM | POA: Diagnosis not present

## 2022-04-05 DIAGNOSIS — M9903 Segmental and somatic dysfunction of lumbar region: Secondary | ICD-10-CM | POA: Diagnosis not present

## 2022-04-05 DIAGNOSIS — M9905 Segmental and somatic dysfunction of pelvic region: Secondary | ICD-10-CM | POA: Diagnosis not present

## 2022-04-05 DIAGNOSIS — M9904 Segmental and somatic dysfunction of sacral region: Secondary | ICD-10-CM | POA: Diagnosis not present

## 2022-04-05 DIAGNOSIS — M9901 Segmental and somatic dysfunction of cervical region: Secondary | ICD-10-CM | POA: Diagnosis not present

## 2022-04-11 DIAGNOSIS — M9904 Segmental and somatic dysfunction of sacral region: Secondary | ICD-10-CM | POA: Diagnosis not present

## 2022-04-11 DIAGNOSIS — M9905 Segmental and somatic dysfunction of pelvic region: Secondary | ICD-10-CM | POA: Diagnosis not present

## 2022-04-11 DIAGNOSIS — M9901 Segmental and somatic dysfunction of cervical region: Secondary | ICD-10-CM | POA: Diagnosis not present

## 2022-04-11 DIAGNOSIS — M9903 Segmental and somatic dysfunction of lumbar region: Secondary | ICD-10-CM | POA: Diagnosis not present

## 2022-04-18 DIAGNOSIS — M9905 Segmental and somatic dysfunction of pelvic region: Secondary | ICD-10-CM | POA: Diagnosis not present

## 2022-04-18 DIAGNOSIS — M9904 Segmental and somatic dysfunction of sacral region: Secondary | ICD-10-CM | POA: Diagnosis not present

## 2022-04-18 DIAGNOSIS — M9903 Segmental and somatic dysfunction of lumbar region: Secondary | ICD-10-CM | POA: Diagnosis not present

## 2022-04-18 DIAGNOSIS — M9901 Segmental and somatic dysfunction of cervical region: Secondary | ICD-10-CM | POA: Diagnosis not present

## 2022-05-09 DIAGNOSIS — M9904 Segmental and somatic dysfunction of sacral region: Secondary | ICD-10-CM | POA: Diagnosis not present

## 2022-05-09 DIAGNOSIS — M9903 Segmental and somatic dysfunction of lumbar region: Secondary | ICD-10-CM | POA: Diagnosis not present

## 2022-05-09 DIAGNOSIS — M9905 Segmental and somatic dysfunction of pelvic region: Secondary | ICD-10-CM | POA: Diagnosis not present

## 2022-05-09 DIAGNOSIS — M9901 Segmental and somatic dysfunction of cervical region: Secondary | ICD-10-CM | POA: Diagnosis not present

## 2022-05-27 DIAGNOSIS — R92321 Mammographic fibroglandular density, right breast: Secondary | ICD-10-CM | POA: Diagnosis not present

## 2022-05-27 DIAGNOSIS — Z1231 Encounter for screening mammogram for malignant neoplasm of breast: Secondary | ICD-10-CM | POA: Diagnosis not present

## 2022-05-31 DIAGNOSIS — N3946 Mixed incontinence: Secondary | ICD-10-CM | POA: Diagnosis not present

## 2022-06-06 DIAGNOSIS — M9901 Segmental and somatic dysfunction of cervical region: Secondary | ICD-10-CM | POA: Diagnosis not present

## 2022-06-06 DIAGNOSIS — M9904 Segmental and somatic dysfunction of sacral region: Secondary | ICD-10-CM | POA: Diagnosis not present

## 2022-06-06 DIAGNOSIS — M9905 Segmental and somatic dysfunction of pelvic region: Secondary | ICD-10-CM | POA: Diagnosis not present

## 2022-06-06 DIAGNOSIS — M9903 Segmental and somatic dysfunction of lumbar region: Secondary | ICD-10-CM | POA: Diagnosis not present

## 2022-06-27 DIAGNOSIS — M9901 Segmental and somatic dysfunction of cervical region: Secondary | ICD-10-CM | POA: Diagnosis not present

## 2022-06-27 DIAGNOSIS — M9905 Segmental and somatic dysfunction of pelvic region: Secondary | ICD-10-CM | POA: Diagnosis not present

## 2022-06-27 DIAGNOSIS — M9903 Segmental and somatic dysfunction of lumbar region: Secondary | ICD-10-CM | POA: Diagnosis not present

## 2022-06-27 DIAGNOSIS — M9904 Segmental and somatic dysfunction of sacral region: Secondary | ICD-10-CM | POA: Diagnosis not present

## 2022-07-18 DIAGNOSIS — M9903 Segmental and somatic dysfunction of lumbar region: Secondary | ICD-10-CM | POA: Diagnosis not present

## 2022-07-18 DIAGNOSIS — M9904 Segmental and somatic dysfunction of sacral region: Secondary | ICD-10-CM | POA: Diagnosis not present

## 2022-07-18 DIAGNOSIS — M9901 Segmental and somatic dysfunction of cervical region: Secondary | ICD-10-CM | POA: Diagnosis not present

## 2022-07-18 DIAGNOSIS — M9905 Segmental and somatic dysfunction of pelvic region: Secondary | ICD-10-CM | POA: Diagnosis not present

## 2022-08-01 DIAGNOSIS — M7918 Myalgia, other site: Secondary | ICD-10-CM | POA: Diagnosis not present

## 2022-08-01 DIAGNOSIS — I878 Other specified disorders of veins: Secondary | ICD-10-CM | POA: Diagnosis not present

## 2022-08-01 DIAGNOSIS — M159 Polyosteoarthritis, unspecified: Secondary | ICD-10-CM | POA: Diagnosis not present

## 2022-08-08 DIAGNOSIS — M9904 Segmental and somatic dysfunction of sacral region: Secondary | ICD-10-CM | POA: Diagnosis not present

## 2022-08-08 DIAGNOSIS — M9905 Segmental and somatic dysfunction of pelvic region: Secondary | ICD-10-CM | POA: Diagnosis not present

## 2022-08-08 DIAGNOSIS — M9903 Segmental and somatic dysfunction of lumbar region: Secondary | ICD-10-CM | POA: Diagnosis not present

## 2022-08-08 DIAGNOSIS — M9901 Segmental and somatic dysfunction of cervical region: Secondary | ICD-10-CM | POA: Diagnosis not present

## 2022-09-05 DIAGNOSIS — M9901 Segmental and somatic dysfunction of cervical region: Secondary | ICD-10-CM | POA: Diagnosis not present

## 2022-09-05 DIAGNOSIS — M9904 Segmental and somatic dysfunction of sacral region: Secondary | ICD-10-CM | POA: Diagnosis not present

## 2022-09-05 DIAGNOSIS — M9903 Segmental and somatic dysfunction of lumbar region: Secondary | ICD-10-CM | POA: Diagnosis not present

## 2022-09-05 DIAGNOSIS — M9905 Segmental and somatic dysfunction of pelvic region: Secondary | ICD-10-CM | POA: Diagnosis not present

## 2022-09-12 DIAGNOSIS — M9905 Segmental and somatic dysfunction of pelvic region: Secondary | ICD-10-CM | POA: Diagnosis not present

## 2022-09-12 DIAGNOSIS — M9901 Segmental and somatic dysfunction of cervical region: Secondary | ICD-10-CM | POA: Diagnosis not present

## 2022-09-12 DIAGNOSIS — M9903 Segmental and somatic dysfunction of lumbar region: Secondary | ICD-10-CM | POA: Diagnosis not present

## 2022-09-12 DIAGNOSIS — M9904 Segmental and somatic dysfunction of sacral region: Secondary | ICD-10-CM | POA: Diagnosis not present

## 2022-09-25 DIAGNOSIS — C50912 Malignant neoplasm of unspecified site of left female breast: Secondary | ICD-10-CM | POA: Diagnosis not present

## 2022-09-26 DIAGNOSIS — M9903 Segmental and somatic dysfunction of lumbar region: Secondary | ICD-10-CM | POA: Diagnosis not present

## 2022-09-26 DIAGNOSIS — M9905 Segmental and somatic dysfunction of pelvic region: Secondary | ICD-10-CM | POA: Diagnosis not present

## 2022-09-26 DIAGNOSIS — M9904 Segmental and somatic dysfunction of sacral region: Secondary | ICD-10-CM | POA: Diagnosis not present

## 2022-09-26 DIAGNOSIS — M9901 Segmental and somatic dysfunction of cervical region: Secondary | ICD-10-CM | POA: Diagnosis not present

## 2022-10-15 DIAGNOSIS — C50912 Malignant neoplasm of unspecified site of left female breast: Secondary | ICD-10-CM | POA: Diagnosis not present

## 2022-10-17 DIAGNOSIS — M9904 Segmental and somatic dysfunction of sacral region: Secondary | ICD-10-CM | POA: Diagnosis not present

## 2022-10-17 DIAGNOSIS — M9901 Segmental and somatic dysfunction of cervical region: Secondary | ICD-10-CM | POA: Diagnosis not present

## 2022-10-17 DIAGNOSIS — M9903 Segmental and somatic dysfunction of lumbar region: Secondary | ICD-10-CM | POA: Diagnosis not present

## 2022-10-17 DIAGNOSIS — M9905 Segmental and somatic dysfunction of pelvic region: Secondary | ICD-10-CM | POA: Diagnosis not present

## 2022-11-07 DIAGNOSIS — M9903 Segmental and somatic dysfunction of lumbar region: Secondary | ICD-10-CM | POA: Diagnosis not present

## 2022-11-07 DIAGNOSIS — M9901 Segmental and somatic dysfunction of cervical region: Secondary | ICD-10-CM | POA: Diagnosis not present

## 2022-11-07 DIAGNOSIS — M9905 Segmental and somatic dysfunction of pelvic region: Secondary | ICD-10-CM | POA: Diagnosis not present

## 2022-11-07 DIAGNOSIS — M9904 Segmental and somatic dysfunction of sacral region: Secondary | ICD-10-CM | POA: Diagnosis not present

## 2022-11-21 DIAGNOSIS — M9905 Segmental and somatic dysfunction of pelvic region: Secondary | ICD-10-CM | POA: Diagnosis not present

## 2022-11-21 DIAGNOSIS — M9901 Segmental and somatic dysfunction of cervical region: Secondary | ICD-10-CM | POA: Diagnosis not present

## 2022-11-21 DIAGNOSIS — M9904 Segmental and somatic dysfunction of sacral region: Secondary | ICD-10-CM | POA: Diagnosis not present

## 2022-11-21 DIAGNOSIS — M9903 Segmental and somatic dysfunction of lumbar region: Secondary | ICD-10-CM | POA: Diagnosis not present

## 2022-11-28 DIAGNOSIS — M9903 Segmental and somatic dysfunction of lumbar region: Secondary | ICD-10-CM | POA: Diagnosis not present

## 2022-11-28 DIAGNOSIS — M9904 Segmental and somatic dysfunction of sacral region: Secondary | ICD-10-CM | POA: Diagnosis not present

## 2022-11-28 DIAGNOSIS — M9905 Segmental and somatic dysfunction of pelvic region: Secondary | ICD-10-CM | POA: Diagnosis not present

## 2022-11-28 DIAGNOSIS — M9901 Segmental and somatic dysfunction of cervical region: Secondary | ICD-10-CM | POA: Diagnosis not present

## 2022-12-02 DIAGNOSIS — N3281 Overactive bladder: Secondary | ICD-10-CM | POA: Diagnosis not present

## 2022-12-02 DIAGNOSIS — M810 Age-related osteoporosis without current pathological fracture: Secondary | ICD-10-CM | POA: Diagnosis not present

## 2022-12-02 DIAGNOSIS — N3946 Mixed incontinence: Secondary | ICD-10-CM | POA: Diagnosis not present

## 2022-12-19 DIAGNOSIS — M9905 Segmental and somatic dysfunction of pelvic region: Secondary | ICD-10-CM | POA: Diagnosis not present

## 2022-12-19 DIAGNOSIS — M9903 Segmental and somatic dysfunction of lumbar region: Secondary | ICD-10-CM | POA: Diagnosis not present

## 2022-12-19 DIAGNOSIS — M9904 Segmental and somatic dysfunction of sacral region: Secondary | ICD-10-CM | POA: Diagnosis not present

## 2022-12-19 DIAGNOSIS — M9901 Segmental and somatic dysfunction of cervical region: Secondary | ICD-10-CM | POA: Diagnosis not present

## 2022-12-25 DIAGNOSIS — M79605 Pain in left leg: Secondary | ICD-10-CM | POA: Diagnosis not present

## 2022-12-25 DIAGNOSIS — M159 Polyosteoarthritis, unspecified: Secondary | ICD-10-CM | POA: Diagnosis not present

## 2022-12-25 DIAGNOSIS — M79604 Pain in right leg: Secondary | ICD-10-CM | POA: Diagnosis not present

## 2023-01-08 DIAGNOSIS — M9903 Segmental and somatic dysfunction of lumbar region: Secondary | ICD-10-CM | POA: Diagnosis not present

## 2023-01-08 DIAGNOSIS — M9905 Segmental and somatic dysfunction of pelvic region: Secondary | ICD-10-CM | POA: Diagnosis not present

## 2023-01-08 DIAGNOSIS — M9904 Segmental and somatic dysfunction of sacral region: Secondary | ICD-10-CM | POA: Diagnosis not present

## 2023-01-08 DIAGNOSIS — M9901 Segmental and somatic dysfunction of cervical region: Secondary | ICD-10-CM | POA: Diagnosis not present

## 2023-01-14 DIAGNOSIS — Z8249 Family history of ischemic heart disease and other diseases of the circulatory system: Secondary | ICD-10-CM | POA: Diagnosis not present

## 2023-01-14 DIAGNOSIS — Z973 Presence of spectacles and contact lenses: Secondary | ICD-10-CM | POA: Diagnosis not present

## 2023-01-14 DIAGNOSIS — G3184 Mild cognitive impairment, so stated: Secondary | ICD-10-CM | POA: Diagnosis not present

## 2023-01-14 DIAGNOSIS — M1990 Unspecified osteoarthritis, unspecified site: Secondary | ICD-10-CM | POA: Diagnosis not present

## 2023-01-14 DIAGNOSIS — Z853 Personal history of malignant neoplasm of breast: Secondary | ICD-10-CM | POA: Diagnosis not present

## 2023-01-14 DIAGNOSIS — Z96649 Presence of unspecified artificial hip joint: Secondary | ICD-10-CM | POA: Diagnosis not present

## 2023-01-14 DIAGNOSIS — N182 Chronic kidney disease, stage 2 (mild): Secondary | ICD-10-CM | POA: Diagnosis not present

## 2023-01-14 DIAGNOSIS — R32 Unspecified urinary incontinence: Secondary | ICD-10-CM | POA: Diagnosis not present

## 2023-01-14 DIAGNOSIS — M810 Age-related osteoporosis without current pathological fracture: Secondary | ICD-10-CM | POA: Diagnosis not present

## 2023-01-14 DIAGNOSIS — M4800 Spinal stenosis, site unspecified: Secondary | ICD-10-CM | POA: Diagnosis not present

## 2023-01-14 DIAGNOSIS — Z809 Family history of malignant neoplasm, unspecified: Secondary | ICD-10-CM | POA: Diagnosis not present

## 2023-01-14 DIAGNOSIS — I872 Venous insufficiency (chronic) (peripheral): Secondary | ICD-10-CM | POA: Diagnosis not present

## 2023-01-16 ENCOUNTER — Ambulatory Visit: Payer: Medicare HMO | Admitting: Podiatry

## 2023-01-16 DIAGNOSIS — M7751 Other enthesopathy of right foot: Secondary | ICD-10-CM

## 2023-01-16 NOTE — Progress Notes (Unsigned)
Subjective: Chief Complaint  Patient presents with   Foot Pain    Patient came in today for right ankle pain, capsulitis, rate of pain 8 out of 10, Tx: injection,    It is better now but she has decreased her walking and when she made the apppt. She is taking some natural pain pills. No injuries/falls.   Objective: AAO x3, NAD DP/PT pulses palpable bilaterally, CRT less than 3 seconds Protective sensation intact with Simms Weinstein monofilament, vibratory sensation intact, Achilles tendon reflex intact No areas of pinpoint bony tenderness or pain with vibratory sensation. MMT 5/5, ROM WNL. No edema, erythema, increase in warmth to bilateral lower extremities.  No open lesions or pre-ulcerative lesions.  No pain with calf compression, swelling, warmth, erythema  Assessment:  Plan: -All treatment options discussed with the patient including all alternatives, risks, complications.   -Patient encouraged to call the office with any questions, concerns, change in symptoms.

## 2023-01-16 NOTE — Patient Instructions (Signed)

## 2023-01-27 ENCOUNTER — Ambulatory Visit: Payer: Medicare HMO | Admitting: Physical Therapy

## 2023-01-30 DIAGNOSIS — M9904 Segmental and somatic dysfunction of sacral region: Secondary | ICD-10-CM | POA: Diagnosis not present

## 2023-01-30 DIAGNOSIS — M9901 Segmental and somatic dysfunction of cervical region: Secondary | ICD-10-CM | POA: Diagnosis not present

## 2023-01-30 DIAGNOSIS — M9903 Segmental and somatic dysfunction of lumbar region: Secondary | ICD-10-CM | POA: Diagnosis not present

## 2023-01-30 DIAGNOSIS — M9905 Segmental and somatic dysfunction of pelvic region: Secondary | ICD-10-CM | POA: Diagnosis not present

## 2023-02-20 DIAGNOSIS — M9903 Segmental and somatic dysfunction of lumbar region: Secondary | ICD-10-CM | POA: Diagnosis not present

## 2023-02-20 DIAGNOSIS — M9905 Segmental and somatic dysfunction of pelvic region: Secondary | ICD-10-CM | POA: Diagnosis not present

## 2023-02-20 DIAGNOSIS — M9901 Segmental and somatic dysfunction of cervical region: Secondary | ICD-10-CM | POA: Diagnosis not present

## 2023-02-20 DIAGNOSIS — M9904 Segmental and somatic dysfunction of sacral region: Secondary | ICD-10-CM | POA: Diagnosis not present

## 2023-02-27 DIAGNOSIS — Z961 Presence of intraocular lens: Secondary | ICD-10-CM | POA: Diagnosis not present

## 2023-02-27 DIAGNOSIS — H04123 Dry eye syndrome of bilateral lacrimal glands: Secondary | ICD-10-CM | POA: Diagnosis not present

## 2023-02-27 DIAGNOSIS — H52203 Unspecified astigmatism, bilateral: Secondary | ICD-10-CM | POA: Diagnosis not present

## 2023-03-13 DIAGNOSIS — M9903 Segmental and somatic dysfunction of lumbar region: Secondary | ICD-10-CM | POA: Diagnosis not present

## 2023-03-13 DIAGNOSIS — M9905 Segmental and somatic dysfunction of pelvic region: Secondary | ICD-10-CM | POA: Diagnosis not present

## 2023-03-13 DIAGNOSIS — M9901 Segmental and somatic dysfunction of cervical region: Secondary | ICD-10-CM | POA: Diagnosis not present

## 2023-03-13 DIAGNOSIS — M9904 Segmental and somatic dysfunction of sacral region: Secondary | ICD-10-CM | POA: Diagnosis not present

## 2023-03-18 ENCOUNTER — Other Ambulatory Visit: Payer: Self-pay

## 2023-03-18 ENCOUNTER — Ambulatory Visit: Payer: Medicare HMO | Attending: Geriatric Medicine | Admitting: Physical Therapy

## 2023-03-18 DIAGNOSIS — N3946 Mixed incontinence: Secondary | ICD-10-CM | POA: Insufficient documentation

## 2023-03-18 DIAGNOSIS — R293 Abnormal posture: Secondary | ICD-10-CM | POA: Diagnosis not present

## 2023-03-18 DIAGNOSIS — R279 Unspecified lack of coordination: Secondary | ICD-10-CM | POA: Insufficient documentation

## 2023-03-18 DIAGNOSIS — M6281 Muscle weakness (generalized): Secondary | ICD-10-CM | POA: Diagnosis not present

## 2023-03-18 NOTE — Patient Instructions (Signed)
Bladder Irritants  Certain foods and beverages can be irritating to the bladder.  Avoiding these irritants may decrease your symptoms of urinary urgency, frequency or bladder pain.  Even reducing your intake can help with your symptoms.  Not everyone is sensitive to all bladder irritants, so you may consider focusing on one irritant at a time, removing or reducing your intake of that irritant for 7-10 days to see if this change helps your symptoms.  Water intake is also very important.  Below is a list of bladder irritants.  Drinks: alcohol, carbonated beverages, caffeinated beverages such as coffee and tea, drinks with artificial sweeteners, citrus juices, apple juice, tomato juice  Foods: tomatoes and tomato based foods, spicy food, sugar and artificial sweeteners, vinegar, chocolate, raw onion, apples, citrus fruits, pineapple, cranberries, tomatoes, strawberries, plums, peaches, cantaloupe  Other: acidic urine (too concentrated) - see water intake info below  Substitutes you can try that are NOT irritating to the bladder: cooked onion, pears, papayas, sun-brewed decaf teas, watermelons, non-citrus herbal teas, apricots, kava and low-acid instant drinks (Postum).    WATER INTAKE: Remember to drink lots of water (aim for fluid intake of half your body weight with 2/3 of fluids being water).  You may be limiting fluids due to fear of leakage, but this can actually worsen urgency symptoms due to highly concentrated urine.  Water helps balance the pH of your urine so it doesn't become too acidic - acidic urine is a bladder irritant!   Urge Incontinence  Ideal urination frequency is every 2-4 wakeful hours, which equates to 5-8 times within a 24-hour period.   Urge incontinence is leakage that occurs when the bladder muscle contracts, creating a sudden need to go before getting to the bathroom.   Going too often when your bladder isn't actually full can disrupt the body's automatic signals to  store and hold urine longer, which will increase urgency/frequency.  In this case, the bladder "is running the show" and strategies can be learned to retrain this pattern.   One should be able to control the first urge to urinate, at around .  The bladder can hold up to a "grande latte," or . To help you gain control, practice the Urge Drill below when urgency strikes.  This drill will help retrain your bladder signals and allow you to store and hold urine longer.  The overall goal is to stretch out your time between voids to reach a more manageable voiding schedule.    Practice your "quick flicks" often throughout the day (each waking hour) even when you don't need feel the urge to go.  This will help strengthen your pelvic floor muscles, making them more effective in controlling leakage.  Urge Drill  When you feel an urge to go, follow these steps to regain control: Stop what you are doing and be still Take one deep breath, directing your air into your abdomen Think an affirming thought, such as "I've got this." Do 5 quick flicks of your pelvic floor Walk with control to the bathroom to void, or delay voiding

## 2023-03-18 NOTE — Therapy (Signed)
OUTPATIENT PHYSICAL THERAPY FEMALE PELVIC EVALUATION   Patient Name: Heather Rhodes MRN: [id number] DOB:[birth year], 89 y.o., female Today's Date: 03/18/2023  END OF SESSION:  PT End of Session - 03/18/23 1446     Visit Number 1    Date for PT Re-Evaluation 07/18/23    Authorization Type [employer]    PT Start Time 1445    PT Stop Time 1525    PT Time Calculation (min) 40 min             Past Medical History:  Diagnosis Date   Arthritis    Cancer (HCC)    breast left and endometrial cancer   Complication of anesthesia    during hysterectomy 2012 had to wake up took 3 hours and BP was low   Personal history of chemotherapy    with endometrial cancer   Pneumonia    Squamous cell carcinoma of skin    right hand scc per patient   Past Surgical History:  Procedure Laterality Date   ABDOMINAL HYSTERECTOMY     CARPAL TUNNEL RELEASE     MASTECTOMY     left  2006   TOTAL HIP ARTHROPLASTY Right 12/09/2018   Procedure: RIGHT TOTAL HIP ARTHROPLASTY ANTERIOR APPROACH;  Surgeon: Ollen Gross, MD;  Location: [facility];  Service: Orthopedics;  Laterality: Right;   TRIGGER FINGER RELEASE     Patient Active Problem List   Diagnosis Date Noted   Chronic pain of both ankles 11/02/2019   History of total right hip replacement 12/21/2018   OA (osteoarthritis) of hip 12/09/2018   Pain of both hip joints 05/22/2018   Hyperlipidemia LDL goal <130 03/01/2016   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 04/11/2015   Lumbar degenerative disc disease 12/27/2014   Osteoarthritis of spine with radiculopathy, lumbar region 12/27/2014   Spinal stenosis of lumbar region 11/29/2014   Atrophic vaginitis 04/22/2014   Carpal tunnel syndrome 04/22/2014   Dyslipidemia 04/22/2014   Dysuria 04/22/2014   Ganglion of right knee 04/22/2014   Gout 04/22/2014   Increased frequency of urination 04/22/2014   Iron deficiency 04/22/2014   Iron deficiency anemia due to chronic blood loss 04/22/2014   Lung infiltrate on CT  04/22/2014   Prepatellar bursitis 04/22/2014   Senile osteoporosis 04/22/2014   Urinary tract infection 04/22/2014   Vitamin D deficiency 04/22/2014   Personal history of malignant neoplasm of other parts of uterus 06/26/2011    PCP: Lula Olszewski, MD  REFERRING PROVIDER: Theodis Shove, DO   REFERRING DIAG: [phone] (ICD-10-CM) - Mixed incontinence  THERAPY DIAG:  Muscle weakness (generalized)  Abnormal posture  Unspecified lack of coordination  Rationale for Evaluation and Treatment: Rehabilitation  ONSET DATE: 2012  SUBJECTIVE:  SUBJECTIVE STATEMENT: Pt reports that her dr. York Spaniel she needs to come at her annual. She reports that she leaks when she waits too long, when she stands in the kitchen and turns the water on. Wears pads all the time. She can't make it to the bathroom. Sometimes Had 3 children - vaginal deliveries, some tearing.  Does not have pelvic pain or low back pain. Pt reports that she goes to the gym- stretching, floor exercises. Used to kayak, walk, hike. Reports that she can't do the things she used to love.  Her symptoms started in 2012 after hysterectomy. She had endometrial cancer. In the morning her ankles hurt, her ankles and knees are not happy. She pain amb with SPC. Extended walking in the grocery store makes her pain worse. Decreased mobility is contributing to it.  At times she reports that she does not want to get up when she has the urge, when she does not move, urge goes away. At one point she was on medication and made urge worse. Reports that she has scoliosis, stenosis.   Fluid intake: Yes: 2-8 cups of water, 2 cups of coffee, juice    PAIN:  Are you having pain? No - not pelvic or back, does have Rt shoulder pain at night since having 3rd COVID shot.   NPRS scale: no number given but constant Pain location:  global   Pain type: aching and dull Pain description: intermittent   Aggravating factors: walking, not stretching Relieving factors: stretching  PRECAUTIONS: None  RED FLAGS: Bowel or bladder incontinence: Yes: both    WEIGHT BEARING RESTRICTIONS: No  FALLS:  Has patient fallen in last 6 months? No  LIVING ENVIRONMENT: Lives with: lives with their family and lives in an assisted living facility Lives in: House/apartment Stairs: No Has following equipment at home: Single point cane  OCCUPATION: retired  PLOF: Independent with gait  PATIENT GOALS: to have less leakage  PERTINENT HISTORY:  Arthritis, breast left and endometrial cancer chemotherapy, hysterectomy 2012, Rt TOTAL HIP ARTHROPLASTY, osteoarthritis, Atrophic vaginitis Sexual abuse: not asked  BOWEL MOVEMENT: Pain with bowel movement: No Type of bowel movement:Type (Bristol Stool Scale) 4-5, Frequency daily, and Strain No Fully empty rectum: No Leakage: Yes: urge incontinence  Pads: Yes: also with urine Fiber supplement: No  URINATION: Pain with urination: No Fully empty bladder: Yes: sometimes some left Stream: Strong and Weak Urgency: Yes:   Frequency: depends on fluid intake but not quicker every 2 hours day, does have urge wake up at night but doesn't want to get up Leakage:  Pads: Yes: 2-3/day  Urge to void, Walking to the bathroom, Coughing, and standing  INTERCOURSE: Pt not sexually active  PREGNANCY: Vaginal deliveries 3 Tearing Yes: 3 C-section deliveries 0 Currently pregnant No  PROLAPSE: None   OBJECTIVE:   DIAGNOSTIC FINDINGS:    PATIENT SURVEYS:   COGNITION: Overall cognitive status: Within functional limits for tasks assessed     SENSATION: Light touch: Appears intact Proprioception: Appears intact  MUSCLE LENGTH: Bil hamstrings and adductors limited by 50% bil   FUNCTIONAL TESTS:  Unable to complete a  full squat  GAIT: Distance walked: 150' Assistive device utilized: Single point cane Level of assistance: Modified independence Comments: very forward flexed posture, forward head, decreased bil step height, decreased cadence, decreased stride length   POSTURE: rounded shoulders, posterior pelvic tilt, and flexed trunk    LUMBARAROM/PROM:  A/PROM A/PROM  eval  Flexion WFL  Extension Limited by 75%  Right lateral  flexion Limited by 75%  Left lateral flexion Limited by 75%  Right rotation Limited by 75%  Left rotation Limited by 75%   (Blank rows = not tested)  LOWER EXTREMITY ROM:  Bil WFL  LOWER EXTREMITY MMT:  Bil hips grossly 3/5, except extension 2+/5; knees 4/5  PALPATION:   General  no TTP but tension in thoracic and lumbar spine                External Perineal Exam no TTP, dryness, and atrophied at vulva                             Internal Pelvic Floor no TTP, but tension in superficial pelvic floor layers  Patient confirms identification and approves PT to assess internal pelvic floor and treatment Yes  PELVIC MMT:   MMT eval  Vaginal 0-1/5 with max attempts and effort  Internal Anal Sphincter   External Anal Sphincter   Puborectalis   Diastasis Recti   (Blank rows = not tested)        TONE: WFL  PROLAPSE: Not seen in hooklying   TODAY'S TREATMENT:                                                                                                                              DATE:   03/18/23 EVAL Examination completed, findings reviewed, pt educated on POC, bladder irritants and urge drill. Pt motivated to participate in PT and agreeable to attempt recommendations.     PATIENT EDUCATION:  Education details: to be given Person educated: Patient Education method: Explanation, Demonstration, Tactile cues, Verbal cues, and Handouts Education comprehension: verbalized understanding and verbal cues required  HOME EXERCISE PROGRAM: To be given    ASSESSMENT:  CLINICAL IMPRESSION: Patient is a 89 y.o. WF  who was seen today for physical therapy evaluation and treatment for mixed incontinence.  .   OBJECTIVE IMPAIRMENTS: Abnormal gait, decreased activity tolerance, decreased balance, decreased coordination, decreased endurance, decreased mobility, difficulty walking, decreased ROM, decreased strength, hypomobility, impaired flexibility, impaired sensation, and pain.   ACTIVITY LIMITATIONS: carrying, lifting, bending, sitting, standing, stairs, transfers, bed mobility, continence, bathing, toileting, dressing, reach over head, and caring for others  PARTICIPATION LIMITATIONS: meal prep, cleaning, laundry, driving, shopping, community activity, and yard work  PERSONAL FACTORS: Age and Time since onset of injury/illness/exacerbation are also affecting patient's functional outcome.   REHAB POTENTIAL: Good  CLINICAL DECISION MAKING: Stable/uncomplicated  EVALUATION COMPLEXITY: Low   GOALS: Goals reviewed with patient? Yes  SHORT TERM GOALS: Target date: 04/15/23  Pt to be I with HEP.  Baseline: Goal status: INITIAL  2.  Pt will have 25% less urgency due to bladder retraining and strengthening  Baseline:  Goal status: INITIAL  3.  Pt to demonstrate at least 2/5 pelvic floor strength for improved pelvic stability and decreased strain at pelvic floor/ decrease leakage.  Baseline:  Goal  status: INITIAL  4.  Pt to demonstrate improved coordination of pelvic floor and breathing mechanics with Sit to stand  with appropriate synergistic patterns to decrease pain and leakage at least 25% of the time.    Baseline:  Goal status: INITIAL    LONG TERM GOALS: Target date: 05/13/23  Pt to be I with advanced HEP.  Baseline:  Goal status: INITIAL  2.  Pt will have 50% less urgency due to bladder retraining and strengthening  Baseline:  Goal status: INITIAL  3.  Pt to demonstrate improved coordination of pelvic floor and  breathing mechanics with squat with appropriate synergistic patterns to decrease pain and leakage at least 75% of the time.    Baseline:  Goal status: INITIAL  4.  Pt to demonstrate at least 4/5 bil hip strength for improved pelvic stability and functional squats without leakage.  Baseline:  Goal status: INITIAL  5.  Pt to demonstrate at least 3/5 pelvic floor strength for improved pelvic stability and decreased strain at pelvic floor/ decrease leakage.  Baseline:  Goal status: INITIAL  6.  Pt to demonstrate TUG test of no more than 13s with LRAD to demonstrate improved gait mechanics and safety for ambulating to the bathroom with less leakage.  Baseline:  Goal status: INITIAL  PLAN:  PT FREQUENCY: 1-2x/week  PT DURATION: 8 weeks  PLANNED INTERVENTIONS: Therapeutic exercises, Therapeutic activity, Neuromuscular re-education, Balance training, Gait training, Patient/Family education, Self Care, Joint mobilization, DME instructions, Aquatic Therapy, Dry Needling, Spinal mobilization, Cryotherapy, Moist heat, scar mobilization, Taping, Biofeedback, Manual therapy, and Re-evaluation  PLAN FOR NEXT SESSION: coordination of pelvic floor and breathing with/out activity, gait and balance, hip and coordination, bladder diary, give HEP, voiding mechanics, hip and core strengthening, posture strengthening    Otelia Sergeant, PT, DPT 09/17/244:42 PM

## 2023-03-25 ENCOUNTER — Ambulatory Visit: Payer: Medicare HMO | Admitting: Physical Therapy

## 2023-03-25 DIAGNOSIS — R279 Unspecified lack of coordination: Secondary | ICD-10-CM

## 2023-03-25 DIAGNOSIS — N3946 Mixed incontinence: Secondary | ICD-10-CM | POA: Diagnosis not present

## 2023-03-25 DIAGNOSIS — M6281 Muscle weakness (generalized): Secondary | ICD-10-CM

## 2023-03-25 DIAGNOSIS — R293 Abnormal posture: Secondary | ICD-10-CM | POA: Diagnosis not present

## 2023-03-25 NOTE — Therapy (Addendum)
OUTPATIENT PHYSICAL THERAPY FEMALE PELVIC TREATMENT   Patient Name: Heather Rhodes MRN: [id number] DOB:10-Oct-1929, 89 y.o., female Today's Date: 03/25/2023  END OF SESSION:  PT End of Session - 03/25/23 1110     Visit Number 2    Date for PT Re-Evaluation 05/13/23    Authorization Type [employer]    PT Start Time 1107   arrival time   PT Stop Time 1145    PT Time Calculation (min) 38 min    Activity Tolerance Patient tolerated treatment well    Behavior During Therapy [facility] for tasks assessed/performed             Past Medical History:  Diagnosis Date   Arthritis    Cancer (HCC)    breast left and endometrial cancer   Complication of anesthesia    during hysterectomy 2012 had to wake up took 3 hours and BP was low   Personal history of chemotherapy    with endometrial cancer   Pneumonia    Squamous cell carcinoma of skin    right hand scc per patient   Past Surgical History:  Procedure Laterality Date   ABDOMINAL HYSTERECTOMY     CARPAL TUNNEL RELEASE     MASTECTOMY     left  2006   TOTAL HIP ARTHROPLASTY Right 12/09/2018   Procedure: RIGHT TOTAL HIP ARTHROPLASTY ANTERIOR APPROACH;  Surgeon: Ollen Gross, MD;  Location: [facility];  Service: Orthopedics;  Laterality: Right;   TRIGGER FINGER RELEASE     Patient Active Problem List   Diagnosis Date Noted   Chronic pain of both ankles 11/02/2019   History of total right hip replacement 12/21/2018   OA (osteoarthritis) of hip 12/09/2018   Pain of both hip joints 05/22/2018   Hyperlipidemia LDL goal <130 03/01/2016   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 04/11/2015   Lumbar degenerative disc disease 12/27/2014   Osteoarthritis of spine with radiculopathy, lumbar region 12/27/2014   Spinal stenosis of lumbar region 11/29/2014   Atrophic vaginitis 04/22/2014   Carpal tunnel syndrome 04/22/2014   Dyslipidemia 04/22/2014   Dysuria 04/22/2014   Ganglion of right knee 04/22/2014   Gout 04/22/2014   Increased frequency of urination  04/22/2014   Iron deficiency 04/22/2014   Iron deficiency anemia due to chronic blood loss 04/22/2014   Lung infiltrate on CT 04/22/2014   Prepatellar bursitis 04/22/2014   Senile osteoporosis 04/22/2014   Urinary tract infection 04/22/2014   Vitamin D deficiency 04/22/2014   Personal history of malignant neoplasm of other parts of uterus 06/26/2011    PCP: Lula Olszewski, MD  REFERRING PROVIDER: Theodis Shove, DO   REFERRING DIAG: [phone] (ICD-10-CM) - Mixed incontinence  THERAPY DIAG:  Muscle weakness (generalized)  Abnormal posture  Unspecified lack of coordination  Rationale for Evaluation and Treatment: Rehabilitation  ONSET DATE: 2012  SUBJECTIVE:  SUBJECTIVE STATEMENT: Pt reports she feels like leakage is a little better. Has been able to make it a little more with urges.   Fluid intake: Yes: 2-8 cups of water, 2 cups of coffee, juice    PAIN:  Are you having pain? No - not pelvic or back, does have Rt shoulder pain at night since having 3rd COVID shot.  NPRS scale: no number given but constant Pain location:  global   Pain type: aching and dull Pain description: intermittent   Aggravating factors: walking, not stretching Relieving factors: stretching  PRECAUTIONS: None  RED FLAGS: Bowel or bladder incontinence: Yes: both    WEIGHT BEARING RESTRICTIONS: No  FALLS:  Has patient fallen in last 6 months? No  LIVING ENVIRONMENT: Lives with: lives with their family and lives in an assisted living facility Lives in: House/apartment Stairs: No Has following equipment at home: Single point cane  OCCUPATION: retired  PLOF: Independent with gait  PATIENT GOALS: to have less leakage  PERTINENT HISTORY:  Arthritis, breast left and endometrial cancer chemotherapy,  hysterectomy 2012, Rt TOTAL HIP ARTHROPLASTY, osteoarthritis, Atrophic vaginitis Sexual abuse: not asked  BOWEL MOVEMENT: Pain with bowel movement: No Type of bowel movement:Type (Bristol Stool Scale) 4-5, Frequency daily, and Strain No Fully empty rectum: No Leakage: Yes: urge incontinence  Pads: Yes: also with urine Fiber supplement: No  URINATION: Pain with urination: No Fully empty bladder: Yes: sometimes some left Stream: Strong and Weak Urgency: Yes:   Frequency: depends on fluid intake but not quicker every 2 hours day, does have urge wake up at night but doesn't want to get up Leakage:  Pads: Yes: 2-3/day  Urge to void, Walking to the bathroom, Coughing, and standing  INTERCOURSE: Pt not sexually active  PREGNANCY: Vaginal deliveries 3 Tearing Yes: 3 C-section deliveries 0 Currently pregnant No  PROLAPSE: None   OBJECTIVE:   DIAGNOSTIC FINDINGS:    PATIENT SURVEYS:   COGNITION: Overall cognitive status: Within functional limits for tasks assessed     SENSATION: Light touch: Appears intact Proprioception: Appears intact  MUSCLE LENGTH: Bil hamstrings and adductors limited by 50% bil   FUNCTIONAL TESTS:  Unable to complete a full squat  GAIT: Distance walked: 150' Assistive device utilized: Single point cane Level of assistance: Modified independence Comments: very forward flexed posture, forward head, decreased bil step height, decreased cadence, decreased stride length   POSTURE: rounded shoulders, posterior pelvic tilt, and flexed trunk    LUMBARAROM/PROM:  A/PROM A/PROM  eval  Flexion WFL  Extension Limited by 75%  Right lateral flexion Limited by 75%  Left lateral flexion Limited by 75%  Right rotation Limited by 75%  Left rotation Limited by 75%   (Blank rows = not tested)  LOWER EXTREMITY ROM:  Bil WFL  LOWER EXTREMITY MMT:  Bil hips grossly 3/5, except extension 2+/5; knees 4/5  PALPATION:   General  no TTP but tension  in thoracic and lumbar spine                External Perineal Exam no TTP, dryness, and atrophied at vulva                             Internal Pelvic Floor no TTP, but tension in superficial pelvic floor layers  Patient confirms identification and approves PT to assess internal pelvic floor and treatment Yes  PELVIC MMT:   MMT eval  Vaginal 0-1/5 with max attempts and  effort  Internal Anal Sphincter   External Anal Sphincter   Puborectalis   Diastasis Recti   (Blank rows = not tested)        TONE: WFL  PROLAPSE: Not seen in hooklying   TODAY'S TREATMENT:                                                                                                                              DATE:   03/18/23 EVAL Examination completed, findings reviewed, pt educated on POC, bladder irritants and urge drill. Pt motivated to participate in PT and agreeable to attempt recommendations.    03/25/23  2x10 ball squeezes Red loop seated hip abduction 2x10 - visual target for increased range at lt hip Red loop seated hip flexion alt 2x10 Seated red band bil shoulder horizontal abduction 2x10 Standing lateral stepping x15 each way with bil UE support Standing bil heel and toe raises x10 with bil UE support Seated lateral trunk stretching 2x30s each gentle active assist Seated cervical extension within tolerance x45s Active assist spinal extension 45s within pt's tolerance and range    PATIENT EDUCATION:  Education details: to be given Person educated: Patient Education method: Programmer, multimedia, Demonstration, Tactile cues, Verbal cues, and Handouts Education comprehension: verbalized understanding and verbal cues required  HOME EXERCISE PROGRAM: To be given   ASSESSMENT:  CLINICAL IMPRESSION: Patient presents for first treatment, tolerated well with brief rest breaks and active assist for posture as needed, cues throughout for technique. Pt doe demonstrate very impaired posture and would  benefit from improved truck and core strengthening for improved posture and gait mechanics to better efficiency with getting to bathroom with less leakage. Pt would benefit from additional PT to further address deficits and decreased leakage.   OBJECTIVE IMPAIRMENTS: Abnormal gait, decreased activity tolerance, decreased balance, decreased coordination, decreased endurance, decreased mobility, difficulty walking, decreased ROM, decreased strength, hypomobility, impaired flexibility, impaired sensation, and pain.   ACTIVITY LIMITATIONS: carrying, lifting, bending, sitting, standing, stairs, transfers, bed mobility, continence, bathing, toileting, dressing, reach over head, and caring for others  PARTICIPATION LIMITATIONS: meal prep, cleaning, laundry, driving, shopping, community activity, and yard work  PERSONAL FACTORS: Age and Time since onset of injury/illness/exacerbation are also affecting patient's functional outcome.   REHAB POTENTIAL: Good  CLINICAL DECISION MAKING: Stable/uncomplicated  EVALUATION COMPLEXITY: Low   GOALS: Goals reviewed with patient? Yes  SHORT TERM GOALS: Target date: 04/15/23  Pt to be I with HEP.  Baseline: Goal status: INITIAL  2.  Pt will have 25% less urgency due to bladder retraining and strengthening  Baseline:  Goal status: INITIAL  3.  Pt to demonstrate at least 2/5 pelvic floor strength for improved pelvic stability and decreased strain at pelvic floor/ decrease leakage.  Baseline:  Goal status: INITIAL  4.  Pt to demonstrate improved coordination of pelvic floor and breathing mechanics with Sit to stand  with appropriate synergistic patterns to decrease pain and leakage at least 25%  of the time.    Baseline:  Goal status: INITIAL    LONG TERM GOALS: Target date: 05/13/23  Pt to be I with advanced HEP.  Baseline:  Goal status: INITIAL  2.  Pt will have 50% less urgency due to bladder retraining and strengthening  Baseline:  Goal  status: INITIAL  3.  Pt to demonstrate improved coordination of pelvic floor and breathing mechanics with squat with appropriate synergistic patterns to decrease pain and leakage at least 75% of the time.    Baseline:  Goal status: INITIAL  4.  Pt to demonstrate at least 4/5 bil hip strength for improved pelvic stability and functional squats without leakage.  Baseline:  Goal status: INITIAL  5.  Pt to demonstrate at least 3/5 pelvic floor strength for improved pelvic stability and decreased strain at pelvic floor/ decrease leakage.  Baseline:  Goal status: INITIAL  6.  Pt to demonstrate TUG test of no more than 13s with LRAD to demonstrate improved gait mechanics and safety for ambulating to the bathroom with less leakage.  Baseline:  Goal status: INITIAL  PLAN:  PT FREQUENCY: 1-2x/week  PT DURATION: 8 weeks  PLANNED INTERVENTIONS: Therapeutic exercises, Therapeutic activity, Neuromuscular re-education, Balance training, Gait training, Patient/Family education, Self Care, Joint mobilization, DME instructions, Aquatic Therapy, Dry Needling, Spinal mobilization, Cryotherapy, Moist heat, scar mobilization, Taping, Biofeedback, Manual therapy, and Re-evaluation  PLAN FOR NEXT SESSION: coordination of pelvic floor and breathing with/out activity, gait and balance, hip and coordination, bladder diary, give HEP, voiding mechanics, hip and core strengthening, posture strengthening    Otelia Sergeant, PT, DPT 03/24/2410:51 AM   PHYSICAL THERAPY DISCHARGE SUMMARY  Visits from Start of Care: 2  Current functional level related to goals / functional outcomes: Unable to formally reassess as pt called to cancel final appointment stating she no longer needed therapy.    Remaining deficits: Unable to formally reassess however pt states she no longer needs PT   Education / Equipment: HEP   Patient agrees to discharge. Patient goals were partially met. Patient is being discharged due to  being pleased with the current functional level.  Otelia Sergeant, PT, DPT 05/13/2409:37 AM

## 2023-04-02 DIAGNOSIS — M9903 Segmental and somatic dysfunction of lumbar region: Secondary | ICD-10-CM | POA: Diagnosis not present

## 2023-04-02 DIAGNOSIS — M9905 Segmental and somatic dysfunction of pelvic region: Secondary | ICD-10-CM | POA: Diagnosis not present

## 2023-04-02 DIAGNOSIS — Z0100 Encounter for examination of eyes and vision without abnormal findings: Secondary | ICD-10-CM | POA: Diagnosis not present

## 2023-04-02 DIAGNOSIS — M9901 Segmental and somatic dysfunction of cervical region: Secondary | ICD-10-CM | POA: Diagnosis not present

## 2023-04-02 DIAGNOSIS — M9904 Segmental and somatic dysfunction of sacral region: Secondary | ICD-10-CM | POA: Diagnosis not present

## 2023-04-15 DIAGNOSIS — M9901 Segmental and somatic dysfunction of cervical region: Secondary | ICD-10-CM | POA: Diagnosis not present

## 2023-04-15 DIAGNOSIS — M9905 Segmental and somatic dysfunction of pelvic region: Secondary | ICD-10-CM | POA: Diagnosis not present

## 2023-04-15 DIAGNOSIS — M9904 Segmental and somatic dysfunction of sacral region: Secondary | ICD-10-CM | POA: Diagnosis not present

## 2023-04-15 DIAGNOSIS — M9903 Segmental and somatic dysfunction of lumbar region: Secondary | ICD-10-CM | POA: Diagnosis not present

## 2023-05-01 ENCOUNTER — Ambulatory Visit: Payer: Medicare HMO | Admitting: Physical Therapy

## 2023-05-08 DIAGNOSIS — M9905 Segmental and somatic dysfunction of pelvic region: Secondary | ICD-10-CM | POA: Diagnosis not present

## 2023-05-08 DIAGNOSIS — M9904 Segmental and somatic dysfunction of sacral region: Secondary | ICD-10-CM | POA: Diagnosis not present

## 2023-05-08 DIAGNOSIS — M9903 Segmental and somatic dysfunction of lumbar region: Secondary | ICD-10-CM | POA: Diagnosis not present

## 2023-05-08 DIAGNOSIS — M9901 Segmental and somatic dysfunction of cervical region: Secondary | ICD-10-CM | POA: Diagnosis not present

## 2023-05-13 DIAGNOSIS — N3946 Mixed incontinence: Secondary | ICD-10-CM | POA: Diagnosis not present

## 2023-05-13 DIAGNOSIS — M25511 Pain in right shoulder: Secondary | ICD-10-CM | POA: Diagnosis not present

## 2023-05-15 ENCOUNTER — Ambulatory Visit: Payer: Medicare HMO | Attending: Geriatric Medicine | Admitting: Physical Therapy

## 2023-05-16 DIAGNOSIS — M25511 Pain in right shoulder: Secondary | ICD-10-CM | POA: Diagnosis not present

## 2023-05-27 DIAGNOSIS — M9903 Segmental and somatic dysfunction of lumbar region: Secondary | ICD-10-CM | POA: Diagnosis not present

## 2023-05-27 DIAGNOSIS — M9904 Segmental and somatic dysfunction of sacral region: Secondary | ICD-10-CM | POA: Diagnosis not present

## 2023-05-27 DIAGNOSIS — M9901 Segmental and somatic dysfunction of cervical region: Secondary | ICD-10-CM | POA: Diagnosis not present

## 2023-05-27 DIAGNOSIS — M9905 Segmental and somatic dysfunction of pelvic region: Secondary | ICD-10-CM | POA: Diagnosis not present

## 2023-05-28 DIAGNOSIS — M25511 Pain in right shoulder: Secondary | ICD-10-CM | POA: Diagnosis not present

## 2023-06-02 DIAGNOSIS — M25511 Pain in right shoulder: Secondary | ICD-10-CM | POA: Diagnosis not present

## 2023-06-12 DIAGNOSIS — M25511 Pain in right shoulder: Secondary | ICD-10-CM | POA: Diagnosis not present

## 2023-06-16 DIAGNOSIS — M25511 Pain in right shoulder: Secondary | ICD-10-CM | POA: Diagnosis not present

## 2023-06-18 DIAGNOSIS — M25511 Pain in right shoulder: Secondary | ICD-10-CM | POA: Diagnosis not present

## 2023-06-19 DIAGNOSIS — M9903 Segmental and somatic dysfunction of lumbar region: Secondary | ICD-10-CM | POA: Diagnosis not present

## 2023-06-19 DIAGNOSIS — M9901 Segmental and somatic dysfunction of cervical region: Secondary | ICD-10-CM | POA: Diagnosis not present

## 2023-06-19 DIAGNOSIS — M9905 Segmental and somatic dysfunction of pelvic region: Secondary | ICD-10-CM | POA: Diagnosis not present

## 2023-06-19 DIAGNOSIS — M9904 Segmental and somatic dysfunction of sacral region: Secondary | ICD-10-CM | POA: Diagnosis not present

## 2023-06-30 DIAGNOSIS — M25511 Pain in right shoulder: Secondary | ICD-10-CM | POA: Diagnosis not present

## 2023-07-03 ENCOUNTER — Ambulatory Visit (INDEPENDENT_AMBULATORY_CARE_PROVIDER_SITE_OTHER): Payer: Medicare HMO | Admitting: Family Medicine

## 2023-07-03 ENCOUNTER — Encounter: Payer: Self-pay | Admitting: Family Medicine

## 2023-07-03 VITALS — BP 102/76 | HR 86 | Temp 97.8°F | Ht 60.0 in | Wt 113.3 lb

## 2023-07-03 DIAGNOSIS — M778 Other enthesopathies, not elsewhere classified: Secondary | ICD-10-CM | POA: Diagnosis not present

## 2023-07-03 MED ORDER — DICLOFENAC SODIUM 75 MG PO TBEC: 75.0000 mg | DELAYED_RELEASE_TABLET | Freq: Two times a day (BID) | ORAL | 2 refills | Status: DC | PRN

## 2023-07-03 NOTE — Progress Notes (Signed)
 New Patient Office Visit  Subjective   Patient ID: Heather Rhodes, female    DOB: [birth year]  Age: 89 y.o. MRN: [id number]  CC:  Chief Complaint  Patient presents with   Establish Care   Shoulder Pain    Patient complains of right shoulder pain x2 months, tried pain and Ibuprofen with no relief, no known injury    HPI Heather Rhodes [facility] presents to establish care Patient was getting care at [facility] prior to coming here. Patient states that she had a history of left breast cancer, had mastectomy in 2004. States she stopped doing mammograms on the left side, states she was told she didn't need to get them any more. States that her main issues are from her arthritis. She has been working with a physical therapist on posture, states that she is not sure if she will work on her shoulder. States that there was no known injury to the arm/shoulder, states that it gradually has gotten a bit better but she still has trouble with range of motion, pulling her elbow behind her body. Also has pain with adduction of her shoulder. Has tried ibuprofen and OTC medications without any improvement. States she occasionally has numbness/tingling in her fingere but not often. She uses a cane to ambulate and usually uses her right hand to use the cane, leans heavily on the cane/ right arm when I observed her gait in the hallway.   Current Outpatient Medications  Medication Instructions   diclofenac  (VOLTAREN ) 75 mg, Oral, 2 times daily PRN   Multiple Vitamin (MULTIVITAMIN PO) 1 tablet, Daily   Multiple Vitamins-Minerals (ZINC PO) Take by mouth. 120mg  calcium daily/15mg  zinc daily   OVER THE COUNTER MEDICATION Alphalfa   OVER THE COUNTER MEDICATION Magnesium 200mg -potassium 99mcg daily   Phenazopyridine HCl (AZO TABS PO) 2 tablets, Daily   Polyethyl Glycol-Propyl Glycol (SYSTANE OP) 1 drop, 3 times daily   VITAMIN D  PO 50 mcg, Daily    Past Medical History:  Diagnosis Date   Arthritis     Breast cancer (HCC)    2004-left breast per patient   Cancer (HCC)    breast left and endometrial cancer   Complication of anesthesia    during hysterectomy 2012 had to wake up took 3 hours and BP was low   Personal history of chemotherapy    with endometrial cancer   Pneumonia    Squamous cell carcinoma of skin    right hand scc per patient   Urinary incontinence    UTI (urinary tract infection)     Past Surgical History:  Procedure Laterality Date   ABDOMINAL HYSTERECTOMY     CARPAL TUNNEL RELEASE     Masectomy Left 2004   MASTECTOMY     left  2006   TONSILLECTOMY     1942   TOTAL HIP ARTHROPLASTY Right 12/09/2018   Procedure: RIGHT TOTAL HIP ARTHROPLASTY ANTERIOR APPROACH;  Surgeon: Melodi Lerner, MD;  Location: [facility];  Service: Orthopedics;  Laterality: Right;   TRIGGER FINGER RELEASE      Family History  Problem Relation Age of Onset   Heart attack Mother    Cancer Son     Social History   Socioeconomic History   Marital status: Divorced    Spouse name: Not on file   Number of children: Not on file   Years of education: Not on file   Highest education level: Some college, no degree  Occupational History   Not on file  Tobacco Use   Smoking status: Former    Types: Cigarettes   Smokeless tobacco: Never   Tobacco comments:    quit 1968  social smoker  Vaping Use   Vaping status: Never Used  Substance and Sexual Activity   Alcohol use: Yes    Comment: rarely   Drug use: Never   Sexual activity: Not Currently  Other Topics Concern   Not on file  Social History Narrative   Not on file   Social Drivers of Health   Financial Resource Strain: Low Risk  (06/29/2023)   Overall Financial Resource Strain (CARDIA)    Difficulty of Paying Living Expenses: Not hard at all  Food Insecurity: No Food Insecurity (06/29/2023)   Hunger Vital Sign    Worried About Running Out of Food in the Last Year: Never true    Ran Out of Food in the Last Year: Never true   Transportation Needs: No Transportation Needs (06/29/2023)   PRAPARE - Administrator, Civil Service (Medical): No    Lack of Transportation (Non-Medical): No  Physical Activity: Insufficiently Active (06/29/2023)   Exercise Vital Sign    Days of Exercise per Week: 2 days    Minutes of Exercise per Session: 30 min  Stress: No Stress Concern Present (06/29/2023)   [employer] of Occupational Health - Occupational Stress Questionnaire    Feeling of Stress : Not at all  Social Connections: Moderately Isolated (06/29/2023)   Social Connection and Isolation Panel [NHANES]    Frequency of Communication with Friends and Family: More than three times a week    Frequency of Social Gatherings with Friends and Family: Once a week    Attends Religious Services: Never    Database Administrator or Organizations: Yes    Attends Engineer, Structural: More than 4 times per year    Marital Status: Divorced  Intimate Partner Violence: Unknown (05/17/2022)   Received from [employer], [facility]   HITS    Physically Hurt: Not on file    Insult or Talk Down To: Not on file    Threaten Physical Harm: Not on file    Scream or Curse: Not on file    Review of Systems  All other systems reviewed and are negative.       Objective   BP 102/76   Pulse 86   Temp 97.8 F (36.6 C) (Oral)   Ht 5' (1.524 m)   Wt 113 lb 4.8 oz (51.4 kg)   SpO2 97%   BMI 22.13 kg/m   Physical Exam Vitals reviewed.  Constitutional:      Appearance: Normal appearance. She is normal weight.  Cardiovascular:     Rate and Rhythm: Normal rate and regular rhythm.     Pulses: Normal pulses.     Heart sounds: Normal heart sounds.  Pulmonary:     Effort: Pulmonary effort is normal.     Breath sounds: Normal breath sounds. No wheezing.  Abdominal:     General: Bowel sounds are normal.  Musculoskeletal:     Right lower leg: No edema.     Left lower leg: No edema.  Neurological:      Mental Status: She is alert and oriented to person, place, and time. Mental status is at baseline.         Assessment & Plan:  Triceps tendonitis -     Diclofenac  Sodium; Take 1 tablet (75 mg total) by mouth 2 (two) times daily  as needed.  Dispense: 60 tablet; Refill: 2  Patient leans heavily on her right arm which operates her cane. I believe this is the primary source of her pain and when I explained this to the patient she seemed to agree. I recommend starting diclofenac  75 mg BID as needed to help with the muscle/tendon pain. If she does not have improvement then I may need to send her to physical therapy to help with strengthening and endurance. I will see her back in 6 months for an annual physical.   Return in about 6 months (around 12/31/2023) for annual physical exam.   Heron CHRISTELLA Sharper, MD

## 2023-07-17 DIAGNOSIS — M9903 Segmental and somatic dysfunction of lumbar region: Secondary | ICD-10-CM | POA: Diagnosis not present

## 2023-07-17 DIAGNOSIS — M9904 Segmental and somatic dysfunction of sacral region: Secondary | ICD-10-CM | POA: Diagnosis not present

## 2023-07-17 DIAGNOSIS — M9901 Segmental and somatic dysfunction of cervical region: Secondary | ICD-10-CM | POA: Diagnosis not present

## 2023-07-17 DIAGNOSIS — M9905 Segmental and somatic dysfunction of pelvic region: Secondary | ICD-10-CM | POA: Diagnosis not present

## 2023-07-21 DIAGNOSIS — M6281 Muscle weakness (generalized): Secondary | ICD-10-CM | POA: Diagnosis not present

## 2023-07-21 DIAGNOSIS — R278 Other lack of coordination: Secondary | ICD-10-CM | POA: Diagnosis not present

## 2023-07-21 DIAGNOSIS — M25511 Pain in right shoulder: Secondary | ICD-10-CM | POA: Diagnosis not present

## 2023-07-28 DIAGNOSIS — M25511 Pain in right shoulder: Secondary | ICD-10-CM | POA: Diagnosis not present

## 2023-07-28 DIAGNOSIS — M6281 Muscle weakness (generalized): Secondary | ICD-10-CM | POA: Diagnosis not present

## 2023-07-28 DIAGNOSIS — R278 Other lack of coordination: Secondary | ICD-10-CM | POA: Diagnosis not present

## 2023-07-31 DIAGNOSIS — M9905 Segmental and somatic dysfunction of pelvic region: Secondary | ICD-10-CM | POA: Diagnosis not present

## 2023-07-31 DIAGNOSIS — M9904 Segmental and somatic dysfunction of sacral region: Secondary | ICD-10-CM | POA: Diagnosis not present

## 2023-07-31 DIAGNOSIS — M9903 Segmental and somatic dysfunction of lumbar region: Secondary | ICD-10-CM | POA: Diagnosis not present

## 2023-07-31 DIAGNOSIS — M9901 Segmental and somatic dysfunction of cervical region: Secondary | ICD-10-CM | POA: Diagnosis not present

## 2023-08-19 DIAGNOSIS — M9905 Segmental and somatic dysfunction of pelvic region: Secondary | ICD-10-CM | POA: Diagnosis not present

## 2023-08-19 DIAGNOSIS — M9904 Segmental and somatic dysfunction of sacral region: Secondary | ICD-10-CM | POA: Diagnosis not present

## 2023-08-19 DIAGNOSIS — M9901 Segmental and somatic dysfunction of cervical region: Secondary | ICD-10-CM | POA: Diagnosis not present

## 2023-08-19 DIAGNOSIS — M9903 Segmental and somatic dysfunction of lumbar region: Secondary | ICD-10-CM | POA: Diagnosis not present

## 2023-09-09 DIAGNOSIS — M9901 Segmental and somatic dysfunction of cervical region: Secondary | ICD-10-CM | POA: Diagnosis not present

## 2023-09-09 DIAGNOSIS — M9904 Segmental and somatic dysfunction of sacral region: Secondary | ICD-10-CM | POA: Diagnosis not present

## 2023-09-09 DIAGNOSIS — M9903 Segmental and somatic dysfunction of lumbar region: Secondary | ICD-10-CM | POA: Diagnosis not present

## 2023-09-09 DIAGNOSIS — M9905 Segmental and somatic dysfunction of pelvic region: Secondary | ICD-10-CM | POA: Diagnosis not present

## 2023-09-30 ENCOUNTER — Ambulatory Visit (INDEPENDENT_AMBULATORY_CARE_PROVIDER_SITE_OTHER)

## 2023-09-30 ENCOUNTER — Encounter: Payer: Self-pay | Admitting: Family Medicine

## 2023-09-30 ENCOUNTER — Ambulatory Visit (INDEPENDENT_AMBULATORY_CARE_PROVIDER_SITE_OTHER): Admitting: Family Medicine

## 2023-09-30 VITALS — BP 124/60 | HR 90 | Temp 97.8°F | Ht 60.0 in | Wt 111.4 lb

## 2023-09-30 DIAGNOSIS — R0781 Pleurodynia: Secondary | ICD-10-CM

## 2023-09-30 DIAGNOSIS — M8580 Other specified disorders of bone density and structure, unspecified site: Secondary | ICD-10-CM | POA: Diagnosis not present

## 2023-09-30 DIAGNOSIS — K449 Diaphragmatic hernia without obstruction or gangrene: Secondary | ICD-10-CM | POA: Diagnosis not present

## 2023-09-30 NOTE — Progress Notes (Signed)
   Established Patient Office Visit  Subjective   Patient ID: Heather Rhodes, female    DOB: 02-22-1930  Age: 89 y.o. MRN: [id number]  Chief Complaint  Patient presents with   Abdominal Pain    Patient complains of right sided abdominal/side pain x6 months, noticed during while doing stretches    Pt is reporting some side pain, states that she has been doing floor exercises and when she starts out it is very tight under the right breast. States that she eventually is able to stretch it out. States that it doesn't hurt at any other time unless she does this particular stretching exercise. States it feels more like a "stitch" in her side. States there is no nausea or pain when she breathes, no constipation or anything like that. States that she does have scoliosis and she goes every 3 weeks .     Current Outpatient Medications  Medication Instructions   diclofenac (VOLTAREN) 75 mg, Oral, 2 times daily PRN   Multiple Vitamin (MULTIVITAMIN PO) 1 tablet, Daily   Multiple Vitamins-Minerals (ZINC PO) Take by mouth. 120mg  calcium daily/15mg  zinc daily   OVER THE COUNTER MEDICATION Alphalfa   OVER THE COUNTER MEDICATION Magnesium 200mg -potassium daily   Phenazopyridine HCl (AZO TABS PO) 2 tablets, Daily   Polyethyl Glycol-Propyl Glycol (SYSTANE OP) 1 drop, 3 times daily   VITAMIN D PO 50 mcg, Daily       Review of Systems  All other systems reviewed and are negative.     Objective:     BP 124/60   Pulse 90   Temp 97.8 F (36.6 C) (Oral)   Ht 5' (1.524 m)   Wt 111 lb 6.4 oz (50.5 kg)   SpO2 96%   BMI 21.76 kg/m     Physical Exam Cardiovascular:     Rate and Rhythm: Normal rate and regular rhythm.     Heart sounds: No murmur heard. Pulmonary:     Effort: Pulmonary effort is normal.     Breath sounds: Normal breath sounds. No wheezing, rhonchi or rales.  Abdominal:     Tenderness: There is abdominal tenderness (right lower costal margin) in the right upper  quadrant. There is no right CVA tenderness, left CVA tenderness, guarding or rebound. Negative signs include Murphy's sign.      No results found for any visits on 09/30/23.     The ASCVD Risk score (Arnett DK, et al., 2019) failed to calculate for the following reasons:   The 2019 ASCVD risk score is only valid for ages 73 to 71    Assessment & Plan:  Rib pain on right side -     DG Ribs Unilateral W/Chest Right; Future  Most likely MSK in origin, pt does have a history of osteoporosis in the past, will check rib films and CXR to rule out other causes, recommend she continue  diclofenac PRN that she was given previously for tendonitis. RTC in 3 months for her annual physical.   No follow-ups on file.    Karie Georges, MD

## 2023-10-02 DIAGNOSIS — M9905 Segmental and somatic dysfunction of pelvic region: Secondary | ICD-10-CM | POA: Diagnosis not present

## 2023-10-02 DIAGNOSIS — M9901 Segmental and somatic dysfunction of cervical region: Secondary | ICD-10-CM | POA: Diagnosis not present

## 2023-10-02 DIAGNOSIS — M9903 Segmental and somatic dysfunction of lumbar region: Secondary | ICD-10-CM | POA: Diagnosis not present

## 2023-10-02 DIAGNOSIS — M9904 Segmental and somatic dysfunction of sacral region: Secondary | ICD-10-CM | POA: Diagnosis not present

## 2023-10-23 ENCOUNTER — Telehealth: Payer: Self-pay | Admitting: *Deleted

## 2023-10-23 NOTE — Telephone Encounter (Signed)
 Noted.

## 2023-10-23 NOTE — Telephone Encounter (Signed)
 Copied from CRM [id number]. Topic: Appointments - Appointment Cancel/Reschedule >> Oct 23, 2023  9:16 AM Oddis Bench wrote: Patient/patient representative is calling to cancel  an appointment. Refer to attachments for appointment information.

## 2023-10-24 ENCOUNTER — Ambulatory Visit: Admitting: Family Medicine

## 2023-10-30 DIAGNOSIS — M9901 Segmental and somatic dysfunction of cervical region: Secondary | ICD-10-CM | POA: Diagnosis not present

## 2023-10-30 DIAGNOSIS — M9903 Segmental and somatic dysfunction of lumbar region: Secondary | ICD-10-CM | POA: Diagnosis not present

## 2023-10-30 DIAGNOSIS — M9905 Segmental and somatic dysfunction of pelvic region: Secondary | ICD-10-CM | POA: Diagnosis not present

## 2023-10-30 DIAGNOSIS — M9904 Segmental and somatic dysfunction of sacral region: Secondary | ICD-10-CM | POA: Diagnosis not present

## 2023-10-31 ENCOUNTER — Ambulatory Visit: Payer: Medicare HMO

## 2023-11-27 DIAGNOSIS — M9905 Segmental and somatic dysfunction of pelvic region: Secondary | ICD-10-CM | POA: Diagnosis not present

## 2023-11-27 DIAGNOSIS — M9903 Segmental and somatic dysfunction of lumbar region: Secondary | ICD-10-CM | POA: Diagnosis not present

## 2023-11-27 DIAGNOSIS — M9901 Segmental and somatic dysfunction of cervical region: Secondary | ICD-10-CM | POA: Diagnosis not present

## 2023-11-27 DIAGNOSIS — M9904 Segmental and somatic dysfunction of sacral region: Secondary | ICD-10-CM | POA: Diagnosis not present

## 2023-12-25 DIAGNOSIS — M9903 Segmental and somatic dysfunction of lumbar region: Secondary | ICD-10-CM | POA: Diagnosis not present

## 2023-12-25 DIAGNOSIS — M9901 Segmental and somatic dysfunction of cervical region: Secondary | ICD-10-CM | POA: Diagnosis not present

## 2023-12-25 DIAGNOSIS — M9904 Segmental and somatic dysfunction of sacral region: Secondary | ICD-10-CM | POA: Diagnosis not present

## 2023-12-25 DIAGNOSIS — M9905 Segmental and somatic dysfunction of pelvic region: Secondary | ICD-10-CM | POA: Diagnosis not present

## 2023-12-31 ENCOUNTER — Encounter: Payer: Self-pay | Admitting: Family Medicine

## 2023-12-31 ENCOUNTER — Ambulatory Visit: Payer: Medicare HMO | Admitting: Family Medicine

## 2023-12-31 VITALS — BP 136/80 | HR 88 | Temp 97.7°F | Ht 60.0 in | Wt 115.3 lb

## 2023-12-31 DIAGNOSIS — M19041 Primary osteoarthritis, right hand: Secondary | ICD-10-CM

## 2023-12-31 DIAGNOSIS — Z0000 Encounter for general adult medical examination without abnormal findings: Secondary | ICD-10-CM

## 2023-12-31 DIAGNOSIS — E611 Iron deficiency: Secondary | ICD-10-CM | POA: Diagnosis not present

## 2023-12-31 DIAGNOSIS — Z780 Asymptomatic menopausal state: Secondary | ICD-10-CM

## 2023-12-31 DIAGNOSIS — E559 Vitamin D deficiency, unspecified: Secondary | ICD-10-CM

## 2023-12-31 DIAGNOSIS — E785 Hyperlipidemia, unspecified: Secondary | ICD-10-CM | POA: Diagnosis not present

## 2023-12-31 DIAGNOSIS — Z131 Encounter for screening for diabetes mellitus: Secondary | ICD-10-CM | POA: Diagnosis not present

## 2023-12-31 LAB — COMPREHENSIVE METABOLIC PANEL WITH GFR
ALT: 20 U/L (ref 0–35)
AST: 27 U/L (ref 0–37)
Albumin: 4.5 g/dL (ref 3.5–5.2)
Alkaline Phosphatase: 75 U/L (ref 39–117)
BUN: 28 mg/dL — ABNORMAL HIGH (ref 6–23)
CO2: 29 meq/L (ref 19–32)
Calcium: 9.8 mg/dL (ref 8.4–10.5)
Chloride: 102 meq/L (ref 96–112)
Creatinine, Ser: 0.95 mg/dL (ref 0.40–1.20)
GFR: 52.31 mL/min — ABNORMAL LOW (ref >60.00)
Glucose, Bld: 90 mg/dL (ref 70–99)
Potassium: 3.9 meq/L (ref 3.5–5.1)
Sodium: 137 meq/L (ref 135–145)
Total Bilirubin: 0.5 mg/dL (ref 0.2–1.2)
Total Protein: 7.8 g/dL (ref 6.0–8.3)

## 2023-12-31 LAB — CBC WITH DIFFERENTIAL/PLATELET
Basophils Absolute: 0.1 10*3/uL (ref 0.0–0.1)
Basophils Relative: 1.4 % (ref 0.0–3.0)
Eosinophils Absolute: 0.2 10*3/uL (ref 0.0–0.7)
Eosinophils Relative: 2.9 % (ref 0.0–5.0)
HCT: 41 % (ref 36.0–46.0)
Hemoglobin: 13.9 g/dL (ref 12.0–15.0)
Lymphocytes Relative: 15.1 % (ref 12.0–46.0)
Lymphs Abs: 1 10*3/uL (ref 0.7–4.0)
MCHC: 33.8 g/dL (ref 30.0–36.0)
MCV: 94.8 fl (ref 78.0–100.0)
Monocytes Absolute: 0.6 10*3/uL (ref 0.1–1.0)
Monocytes Relative: 9.5 % (ref 3.0–12.0)
Neutro Abs: 4.5 10*3/uL (ref 1.4–7.7)
Neutrophils Relative %: 71.1 % (ref 43.0–77.0)
Platelets: 306 10*3/uL (ref 150.0–400.0)
RBC: 4.33 Mil/uL (ref 3.87–5.11)
RDW: 13 % (ref 11.5–15.5)
WBC: 6.3 10*3/uL (ref 4.0–10.5)

## 2023-12-31 LAB — LIPID PANEL
Cholesterol: 197 mg/dL (ref 0–200)
HDL: 52 mg/dL (ref >39.00)
LDL Cholesterol: 120 mg/dL — ABNORMAL HIGH (ref 0–99)
NonHDL: 145.19
Total CHOL/HDL Ratio: 4
Triglycerides: 125 mg/dL (ref 0.0–149.0)
VLDL: 25 mg/dL (ref 0.0–40.0)

## 2023-12-31 LAB — VITAMIN D 25 HYDROXY (VIT D DEFICIENCY, FRACTURES): VITD: 39.29 ng/mL (ref 30.00–100.00)

## 2023-12-31 LAB — HEMOGLOBIN A1C: Hgb A1c MFr Bld: 5.9 % (ref 4.6–6.5)

## 2023-12-31 MED ORDER — DICLOFENAC SODIUM 75 MG PO TBEC: 75.0000 mg | DELAYED_RELEASE_TABLET | Freq: Two times a day (BID) | ORAL | 2 refills | Status: DC | PRN

## 2023-12-31 NOTE — Progress Notes (Signed)
 Annual Wellness Visit     Patient: Heather Rhodes, Female    DOB: [birth year], 89 y.o.   MRN: [id number]  Subjective  Chief Complaint  Patient presents with   Annual Exam    Heather Rhodes is a 89 y.o. female who presents today for her Annual Wellness Visit and her annual physical exam.  She reports consuming a general diet. Gym/ health club routine includes light weights, low impact aerobics, and twice a week, they have activities at [facility]. She generally feels well. She reports sleeping well. She does not have additional problems to discuss today.   Patient Medicare AWV questionnaire was completed by the patient on 12/31/2023 in visit; I have confirmed that all information answered by patient is correct and no changes since this date.  Cardiac Risk Factors include: advanced age (>32men, >74 women);dyslipidemia Vision:Within last year and Dental: No current dental problems and Last dental visit: 1-2 years ago, has a partial, needs to get a new dentist    Objective:    Today's Vitals   12/31/23 1057  BP: 136/80  Pulse: 88  Temp: 97.7 F (36.5 C)  TempSrc: Oral  SpO2: 97%  Weight: 115 lb 4.8 oz (52.3 kg)  Height: 5' (1.524 m)   Body mass index is 22.52 kg/m.     12/31/2023   11:22 AM 03/18/2023    3:23 PM 12/09/2018   11:26 AM 12/03/2018    2:24 PM 08/26/2018    2:54 PM  Advanced Directives  Does Patient Have a Medical Advance Directive? Yes Yes Yes Yes Yes   Type of Advance Directive Living will  Healthcare Power of [place];Living will Healthcare Power of [place];Living will Healthcare Power of [place];Living will  Does patient want to make changes to medical advance directive? No - Patient declined No - Patient declined No - Patient declined   No - Patient declined   Copy of Healthcare Power of Attorney in Chart?   No - copy requested   No - copy requested      Data saved with a previous flowsheet row definition    Current Medications  (verified) Outpatient Encounter Medications as of 12/31/2023  Medication Sig   Multiple Vitamin (MULTIVITAMIN PO) Take 1 tablet by mouth daily.   Multiple Vitamins-Minerals (ZINC PO) Take by mouth. 120mg  calcium daily/15mg  zinc daily   OVER THE COUNTER MEDICATION Alphalfa   OVER THE COUNTER MEDICATION Magnesium 200mg -potassium 99mcg daily   Phenazopyridine HCl (AZO TABS PO) Take 2 tablets by mouth daily.   Polyethyl Glycol-Propyl Glycol (SYSTANE OP) Place 1 drop into both eyes 3 (three) times daily.   VITAMIN D  PO Take 50 mcg by mouth daily.   [DISCONTINUED] diclofenac  (VOLTAREN ) 75 MG EC tablet Take 1 tablet (75 mg total) by mouth 2 (two) times daily as needed.   diclofenac  (VOLTAREN ) 75 MG EC tablet Take 1 tablet (75 mg total) by mouth 2 (two) times daily as needed.   No facility-administered encounter medications on file as of 12/31/2023.    Allergies (verified) Naproxen sodium   History: Past Medical History:  Diagnosis Date   Arthritis    Breast cancer (HCC)    2004-left breast per patient   Cancer [facility])    breast left and endometrial cancer   Complication of anesthesia    during hysterectomy 2012 had to wake up took 3 hours and BP was low   Personal history of chemotherapy    with endometrial cancer   Pneumonia  Squamous cell carcinoma of skin    right hand scc per patient   Urinary incontinence    UTI (urinary tract infection)    Past Surgical History:  Procedure Laterality Date   ABDOMINAL HYSTERECTOMY     CARPAL TUNNEL RELEASE     Masectomy Left 2004   MASTECTOMY     left  2006   TONSILLECTOMY     1942   TOTAL HIP ARTHROPLASTY Right 12/09/2018   Procedure: RIGHT TOTAL HIP ARTHROPLASTY ANTERIOR APPROACH;  Surgeon: Melodi Lerner, MD;  Location: [facility];  Service: Orthopedics;  Laterality: Right;   TRIGGER FINGER RELEASE     Family History  Problem Relation Age of Onset   Heart attack Mother    Cancer Son    Social History   Socioeconomic History    Marital status: Divorced    Spouse name: Not on file   Number of children: Not on file   Years of education: Not on file   Highest education level: Some college, no degree  Occupational History   Not on file  Tobacco Use   Smoking status: Former    Types: Cigarettes   Smokeless tobacco: Never   Tobacco comments:    quit 1968  social smoker  Vaping Use   Vaping status: Never Used  Substance and Sexual Activity   Alcohol use: Yes    Comment: rarely   Drug use: Never   Sexual activity: Not Currently  Other Topics Concern   Not on file  Social History Narrative   Not on file   Social Drivers of Health   Financial Resource Strain: Low Risk  (12/30/2023)   Overall Financial Resource Strain (CARDIA)    Difficulty of Paying Living Expenses: Not hard at all  Food Insecurity: No Food Insecurity (12/30/2023)   Hunger Vital Sign    Worried About Running Out of Food in the Last Year: Never true    Ran Out of Food in the Last Year: Never true  Transportation Needs: No Transportation Needs (12/30/2023)   PRAPARE - Administrator, Civil Service (Medical): No    Lack of Transportation (Non-Medical): No  Physical Activity: Sufficiently Active (12/30/2023)   Exercise Vital Sign    Days of Exercise per Week: 7 days    Minutes of Exercise per Session: 30 min  Stress: No Stress Concern Present (12/30/2023)   [employer] of Occupational Health - Occupational Stress Questionnaire    Feeling of Stress: Not at all  Social Connections: Moderately Isolated (12/30/2023)   Social Connection and Isolation Panel    Frequency of Communication with Friends and Family: More than three times a week    Frequency of Social Gatherings with Friends and Family: Twice a week    Attends Religious Services: Never    Database administrator or Organizations: Yes    Attends Engineer, structural: More than 4 times per year    Marital Status: Divorced    Tobacco Counseling Counseling given: Not  Answered Tobacco comments: quit 1968  social smoker   Clinical Intake:     Pain : No/denies pain     BMI - recorded: 22 Nutritional Status: BMI of 19-24  Normal Nutritional Risks: None Diabetes: No     Interpreter Needed?: No      Activities of Daily Living    12/31/2023   11:23 AM  In your present state of health, do you have any difficulty performing the following activities:  Hearing? 0  Vision? 0  Difficulty concentrating or making decisions? 0  Walking or climbing stairs? 0  Dressing or bathing? 0  Doing errands, shopping? 0  Preparing Food and eating ? N  Using the Toilet? N  In the past six months, have you accidently leaked urine? Y  Do you have problems with loss of bowel control? N  Managing your Medications? N  Managing your Finances? N  Housekeeping or managing your Housekeeping? N   Review of Systems  HENT:  Negative for hearing loss.   Eyes:  Negative for blurred vision.  Respiratory:  Negative for shortness of breath.   Cardiovascular:  Negative for chest pain.  Gastrointestinal: Negative.   Genitourinary: Negative.   Musculoskeletal:  Negative for back pain.  Neurological:  Negative for headaches.  Psychiatric/Behavioral:  Negative for depression.    Today's Vitals   12/31/23 1057  BP: 136/80  Pulse: 88  Temp: 97.7 F (36.5 C)  TempSrc: Oral  SpO2: 97%  Weight: 115 lb 4.8 oz (52.3 kg)  Height: 5' (1.524 m)   Body mass index is 22.52 kg/m.   Physical Exam Vitals reviewed.  Constitutional:      Appearance: Normal appearance. She is well-groomed and normal weight.  HENT:     Right Ear: Tympanic membrane and ear canal normal.     Left Ear: Tympanic membrane and ear canal normal.     Mouth/Throat:     Mouth: Mucous membranes are moist.     Pharynx: No posterior oropharyngeal erythema.  Eyes:     Conjunctiva/sclera: Conjunctivae normal.  Neck:     Thyroid: No thyromegaly.  Cardiovascular:     Rate and Rhythm: Normal rate and  regular rhythm.     Pulses: Normal pulses.     Heart sounds: S1 normal and S2 normal.  Pulmonary:     Effort: Pulmonary effort is normal.     Breath sounds: Normal breath sounds and air entry.  Abdominal:     General: Abdomen is flat. Bowel sounds are normal.     Palpations: Abdomen is soft.  Musculoskeletal:     Right lower leg: No edema.     Left lower leg: No edema.  Lymphadenopathy:     Cervical: No cervical adenopathy.  Neurological:     Mental Status: She is alert and oriented to person, place, and time. Mental status is at baseline.     Gait: Gait is intact.  Psychiatric:        Mood and Affect: Mood and affect normal.        Speech: Speech normal.        Behavior: Behavior normal.        Judgment: Judgment normal.    Patient Care Team: Ozell Heron HERO, MD as PCP - General (Family Medicine) Livingston Rigg, MD (Inactive) as Consulting Physician (Dermatology)  Indicate any recent Medical Services you may have received from other than [facility] providers in the past year (date may be approximate).     Assessment:   This is a routine wellness examination for Heather Rhodes.  Hearing/Vision screen No results found.   Goals Addressed   None    Depression Screen    12/31/2023   10:59 AM 07/03/2023    1:23 PM  PHQ 2/9 Scores  PHQ - 2 Score 0 0  PHQ- 9 Score 0     Fall Risk    12/31/2023   10:59 AM 07/03/2023    1:23 PM  Fall Risk   Falls in the  past year? 0 0  Number falls in past yr: 0 0  Injury with Fall? 0 0  Risk for fall due to : Impaired balance/gait Impaired balance/gait;Impaired mobility  Follow up Falls evaluation completed Falls evaluation completed    MEDICARE RISK AT HOME: Medicare Risk at Home Any stairs in or around the home?: No If so, are there any without handrails?: No Home free of loose throw rugs in walkways, pet beds, electrical cords, etc?: No Adequate lighting in your home to reduce risk of falls?: Yes Life alert?: No Use of a cane, walker or  w/c?: Yes Grab bars in the bathroom?: Yes Shower chair or bench in shower?: No Elevated toilet seat or a handicapped toilet?: No  TIMED UP AND GO:  Was the test performed?  Yes  Length of time to ambulate 10 feet: 8 sec Gait slow and steady without use of assistive device    Cognitive Function:        12/31/2023   11:27 AM  6CIT Screen  What Year? 0 points  What month? 0 points  What time? 0 points  Count back from 20 0 points  Months in reverse 0 points  Repeat phrase 0 points  Total Score 0 points    Immunizations Immunization History  Administered Date(s) Administered   Fluzone Influenza virus vaccine,trivalent (IIV3), split virus 04/05/2016, 04/10/2018   Influenza, High Dose Seasonal PF 04/05/2016, 02/23/2019, 03/27/2021, 03/13/2022   Influenza,inj,Quad PF,6+ Mos 04/05/2016, 04/10/2018   Influenza,inj,quad, With Preservative 04/10/2018   Influenza-Unspecified 04/26/2015, 04/05/2016, 04/10/2018, 02/23/2019, 04/21/2023   Moderna Sars-Covid-2 Vaccination 07/15/2019, 08/12/2019, 05/09/2020   Pfizer Covid-19 Vaccine Bivalent Booster 17yrs & up 10/15/2021   Pneumococcal Conjugate-13 04/26/2015   Pneumococcal Polysaccharide-23 04/12/2008, 03/23/2019   Pneumococcal-Unspecified 04/26/2015   Td 02/20/2005   Td (Adult),5 Lf Tetanus Toxid, Preservative Free 02/20/2005   Tdap 03/17/2018, 03/17/2018   Zoster Recombinant(Shingrix) 08/14/2015   Zoster, Live 08/14/2015   Zoster, Unspecified 08/14/2015    TDAP status: Up to date  Flu Vaccine status: Up to date  Pneumococcal vaccine status: Up to date  Covid-19 vaccine status: Completed vaccines  Qualifies for Shingles Vaccine? Yes   Zostavax completed Yes   Shingrix Completed?: No.    Education has been provided regarding the importance of this vaccine. Patient has been advised to call insurance company to determine out of pocket expense if they have not yet received this vaccine. Advised may also receive vaccine at local  pharmacy or Health Dept. Verbalized acceptance and understanding.  Screening Tests Health Maintenance  Topic Date Due   Zoster Vaccines- Shingrix (2 of 2) 10/09/2015   COVID-19 Vaccine (5 - 2024-25 season) 03/02/2023   INFLUENZA VACCINE  01/30/2024   Medicare Annual Wellness (AWV)  12/30/2024   DTaP/Tdap/Td (5 - Td or Tdap) 03/17/2028   Pneumococcal Vaccine: 50+ Years  Completed   DEXA SCAN  Completed   Hepatitis B Vaccines  Aged Out   HPV VACCINES  Aged Out   Meningococcal B Vaccine  Aged Out    Health Maintenance  Health Maintenance Due  Topic Date Due   Zoster Vaccines- Shingrix (2 of 2) 10/09/2015   COVID-19 Vaccine (5 - 2024-25 season) 03/02/2023    Colorectal cancer screening: No longer required.   Mammogram status: No longer required due to age.  Bone Density status: Completed 07/27/2021. Results reflect: Bone density results: OSTEOPOROSIS. Repeat every 2 years.  Lung Cancer Screening: (Low Dose CT Chest recommended if Age 65-80 years, 20 pack-year currently smoking OR  have quit w/in 15years.) does not qualify.   Lung Cancer Screening Referral: N/A  Additional Screening:  Hepatitis C Screening: does not qualify;   Vision Screening: Recommended annual ophthalmology exams for early detection of glaucoma and other disorders of the eye. Is the patient up to date with their annual eye exam?  Yes  Who is the provider or what is the name of the office in which the patient attends annual eye exams? Pt is unsure of the name If pt is not established with a provider, would they like to be referred to a provider to establish care? No .   Dental Screening: Recommended annual dental exams for proper oral hygiene   Community Resource Referral / Chronic Care Management: CRR required this visit?    CCM required this visit?       Plan:     I have personally reviewed and noted the following in the patient's chart:   Medical and social history Use of alcohol, tobacco or  illicit drugs  Current medications and supplements including opioid prescriptions. Patient is not currently taking opioid prescriptions. Functional ability and status Nutritional status Physical activity Advanced directives List of other physicians Hospitalizations, surgeries, and ER visits in previous 12 months Vitals Screenings to include cognitive, depression, and falls Referrals and appointments  In addition, I have reviewed and discussed with patient certain preventive protocols, quality metrics, and best practice recommendations. A written personalized care plan for preventive services as well as general preventive health recommendations were provided to patient.     Heron CHRISTELLA Sharper, MD   01/07/2024   After Visit Summary: (In Person-Printed) AVS printed and given to the patient  Nurse Notes: N/A  Discussed health benefits of physical activity, and encouraged her to engage in regular exercise appropriate for her age and condition.    Problem List Items Addressed This Visit       Unprioritized   Dyslipidemia - Primary   Relevant Orders   Lipid panel (Completed)   Iron deficiency   Relevant Orders   CBC with Differential/Platelets (Completed)   Vitamin D  deficiency   Relevant Orders   VITAMIN D  25 Hydroxy (Vit-D Deficiency, Fractures) (Completed)   Other Visit Diagnoses       Routine adult health maintenance       Relevant Orders   CMP (Completed)     Primary osteoarthritis of right hand       Relevant Medications   diclofenac  (VOLTAREN ) 75 MG EC tablet     Diabetes mellitus screening       Relevant Orders   Hemoglobin A1c (Completed)     Postmenopausal state       Relevant Orders   DG Bone Density     Routine general medical examination at a health care facility         Encounter for Medicare annual wellness exam           Return in about 6 months (around 07/02/2024).     Heron CHRISTELLA Sharper, MD

## 2024-01-01 ENCOUNTER — Telehealth: Payer: Self-pay | Admitting: *Deleted

## 2024-01-01 NOTE — Telephone Encounter (Signed)
 Communication  Reason for CRM: Patient is following up on lab results, states she can't access her MyChart and wants a call. Agent offered to reset password and send link but patient states not right now she's doing something else        Heather Rhodes [phone]

## 2024-01-06 ENCOUNTER — Ambulatory Visit: Payer: Self-pay | Admitting: Family Medicine

## 2024-01-07 NOTE — Telephone Encounter (Signed)
 I left a message for her on my chart-- can you call her and read it to her? Thanks!

## 2024-01-07 NOTE — Patient Instructions (Signed)

## 2024-01-07 NOTE — Telephone Encounter (Signed)
 Patient informed of the results

## 2024-01-22 DIAGNOSIS — M9901 Segmental and somatic dysfunction of cervical region: Secondary | ICD-10-CM | POA: Diagnosis not present

## 2024-01-22 DIAGNOSIS — M62830 Muscle spasm of back: Secondary | ICD-10-CM | POA: Diagnosis not present

## 2024-01-22 DIAGNOSIS — M9902 Segmental and somatic dysfunction of thoracic region: Secondary | ICD-10-CM | POA: Diagnosis not present

## 2024-01-22 DIAGNOSIS — M542 Cervicalgia: Secondary | ICD-10-CM | POA: Diagnosis not present

## 2024-02-12 DIAGNOSIS — L821 Other seborrheic keratosis: Secondary | ICD-10-CM | POA: Diagnosis not present

## 2024-02-12 DIAGNOSIS — L720 Epidermal cyst: Secondary | ICD-10-CM | POA: Diagnosis not present

## 2024-02-12 DIAGNOSIS — L814 Other melanin hyperpigmentation: Secondary | ICD-10-CM | POA: Diagnosis not present

## 2024-02-12 DIAGNOSIS — M9901 Segmental and somatic dysfunction of cervical region: Secondary | ICD-10-CM | POA: Diagnosis not present

## 2024-02-12 DIAGNOSIS — L820 Inflamed seborrheic keratosis: Secondary | ICD-10-CM | POA: Diagnosis not present

## 2024-02-12 DIAGNOSIS — M542 Cervicalgia: Secondary | ICD-10-CM | POA: Diagnosis not present

## 2024-02-12 DIAGNOSIS — M9902 Segmental and somatic dysfunction of thoracic region: Secondary | ICD-10-CM | POA: Diagnosis not present

## 2024-02-12 DIAGNOSIS — L570 Actinic keratosis: Secondary | ICD-10-CM | POA: Diagnosis not present

## 2024-02-12 DIAGNOSIS — M62830 Muscle spasm of back: Secondary | ICD-10-CM | POA: Diagnosis not present

## 2024-02-13 ENCOUNTER — Telehealth: Payer: Self-pay | Admitting: *Deleted

## 2024-02-13 ENCOUNTER — Encounter: Payer: Self-pay | Admitting: Adult Health

## 2024-02-13 ENCOUNTER — Ambulatory Visit: Admitting: Adult Health

## 2024-02-13 VITALS — BP 120/70 | HR 95 | Temp 98.1°F | Ht 60.0 in | Wt 112.0 lb

## 2024-02-13 DIAGNOSIS — R300 Dysuria: Secondary | ICD-10-CM

## 2024-02-13 DIAGNOSIS — N3000 Acute cystitis without hematuria: Secondary | ICD-10-CM | POA: Diagnosis not present

## 2024-02-13 LAB — POCT URINALYSIS DIPSTICK
Bilirubin, UA: NEGATIVE
Blood, UA: NEGATIVE
Glucose, UA: NEGATIVE
Ketones, UA: NEGATIVE
Protein, UA: NEGATIVE
Spec Grav, UA: 1.015 (ref 1.010–1.025)
Urobilinogen, UA: 0.2 U/dL
pH, UA: 6 (ref 5.0–8.0)

## 2024-02-13 MED ORDER — AMOXICILLIN-POT CLAVULANATE 875-125 MG PO TABS
1.0000 | ORAL_TABLET | Freq: Two times a day (BID) | ORAL | 0 refills | Status: AC
Start: 2024-02-13 — End: 2024-02-18

## 2024-02-13 NOTE — Telephone Encounter (Signed)
 Spoke with the patient and informed her a visit is needed.  Appt scheduled today at 2:30pm with [facility] as PCP does not have any openings.

## 2024-02-13 NOTE — Progress Notes (Signed)
 Subjective:    Patient ID: Heather Rhodes, female    DOB: 1930-04-26, 89 y.o.   MRN: [id number]  HPI 89 year old female who  has a past medical history of Arthritis, Breast cancer (HCC), Cancer (HCC), Complication of anesthesia, Personal history of chemotherapy, Pneumonia, Squamous cell carcinoma of skin, Urinary incontinence, and UTI (urinary tract infection).  She is a patient of Dr. Ozell who I am seeing today for an acute issue. She reports that for the last week she has been experiencing frequent urination up to 6 times a day. She denies dysuria, hematuria, lower pelvic pressure, fevers, or chills. She does not get UTI's often    Review of Systems See HPI   Past Medical History:  Diagnosis Date   Arthritis    Breast cancer (HCC)    2004-left breast per patient   Cancer (HCC)    breast left and endometrial cancer   Complication of anesthesia    during hysterectomy 2012 had to wake up took 3 hours and BP was low   Personal history of chemotherapy    with endometrial cancer   Pneumonia    Squamous cell carcinoma of skin    right hand scc per patient   Urinary incontinence    UTI (urinary tract infection)     Social History   Socioeconomic History   Marital status: Divorced    Spouse name: Not on file   Number of children: Not on file   Years of education: Not on file   Highest education level: Some college, no degree  Occupational History   Not on file  Tobacco Use   Smoking status: Former    Types: Cigarettes   Smokeless tobacco: Never   Tobacco comments:    quit 1968  social smoker  Vaping Use   Vaping status: Never Used  Substance and Sexual Activity   Alcohol use: Yes    Comment: rarely   Drug use: Never   Sexual activity: Not Currently  Other Topics Concern   Not on file  Social History Narrative   Not on file   Social Drivers of Health   Financial Resource Strain: Low Risk  (12/30/2023)   Overall Financial Resource Strain (CARDIA)     Difficulty of Paying Living Expenses: Not hard at all  Food Insecurity: No Food Insecurity (12/30/2023)   Hunger Vital Sign    Worried About Running Out of Food in the Last Year: Never true    Ran Out of Food in the Last Year: Never true  Transportation Needs: No Transportation Needs (12/30/2023)   PRAPARE - Administrator, Civil Service (Medical): No    Lack of Transportation (Non-Medical): No  Physical Activity: Sufficiently Active (12/30/2023)   Exercise Vital Sign    Days of Exercise per Week: 7 days    Minutes of Exercise per Session: 30 min  Stress: No Stress Concern Present (12/30/2023)   [employer] of Occupational Health - Occupational Stress Questionnaire    Feeling of Stress: Not at all  Social Connections: Moderately Isolated (12/30/2023)   Social Connection and Isolation Panel    Frequency of Communication with Friends and Family: More than three times a week    Frequency of Social Gatherings with Friends and Family: Twice a week    Attends Religious Services: Never    Database administrator or Organizations: Yes    Attends Engineer, structural: More than 4 times per year    Marital  Status: Divorced  Intimate Partner Violence: Not At Risk (12/31/2023)   Humiliation, Afraid, Rape, and Kick questionnaire    Fear of Current or Ex-Partner: No    Emotionally Abused: No    Physically Abused: No    Sexually Abused: No    Past Surgical History:  Procedure Laterality Date   ABDOMINAL HYSTERECTOMY     CARPAL TUNNEL RELEASE     Masectomy Left 2004   MASTECTOMY     left  2006   TONSILLECTOMY     1942   TOTAL HIP ARTHROPLASTY Right 12/09/2018   Procedure: RIGHT TOTAL HIP ARTHROPLASTY ANTERIOR APPROACH;  Surgeon: Melodi Lerner, MD;  Location: [facility];  Service: Orthopedics;  Laterality: Right;   TRIGGER FINGER RELEASE      Family History  Problem Relation Age of Onset   Heart attack Mother    Cancer Son     Allergies  Allergen Reactions   Naproxen  Sodium     wheezing    Current Outpatient Medications on File Prior to Visit  Medication Sig Dispense Refill   diclofenac (VOLTAREN) 75 MG EC tablet Take 1 tablet (75 mg total) by mouth 2 (two) times daily as needed. 60 tablet 2   Multiple Vitamin (MULTIVITAMIN PO) Take 1 tablet by mouth daily.     Multiple Vitamins-Minerals (ZINC PO) Take by mouth. 120mg  calcium daily/15mg  zinc daily     OVER THE COUNTER MEDICATION Alphalfa     OVER THE COUNTER MEDICATION Magnesium 200mg -potassium 99mcg daily     Phenazopyridine HCl (AZO TABS PO) Take 2 tablets by mouth daily.     Polyethyl Glycol-Propyl Glycol (SYSTANE OP) Place 1 drop into both eyes 3 (three) times daily.     VITAMIN D PO Take 50 mcg by mouth daily.     No current facility-administered medications on file prior to visit.    BP 120/70   Pulse 95   Temp 98.1 F (36.7 C) (Oral)   Ht 5' (1.524 m)   Wt 112 lb (50.8 kg)   SpO2 95%   BMI 21.87 kg/m       Objective:   Physical Exam Vitals and nursing note reviewed.  Constitutional:      Appearance: Normal appearance.  Cardiovascular:     Rate and Rhythm: Normal rate and regular rhythm.     Pulses: Normal pulses.     Heart sounds: Normal heart sounds.  Pulmonary:     Effort: Pulmonary effort is normal.     Breath sounds: Normal breath sounds.  Abdominal:     General: Abdomen is flat. Bowel sounds are normal.     Palpations: Abdomen is soft.     Tenderness: There is no right CVA tenderness or left CVA tenderness.  Skin:    General: Skin is warm and dry.     Capillary Refill: Capillary refill takes less than 2 seconds.  Neurological:     General: No focal deficit present.     Mental Status: She is alert and oriented to person, place, and time.  Psychiatric:        Mood and Affect: Mood normal.        Behavior: Behavior normal.        Thought Content: Thought content normal.        Judgment: Judgment normal.       Assessment & Plan:  1. Acute cystitis without  hematuria (Primary) - Will treat with Augmentin and change abx if needed once culture results  - amoxicillin-clavulanate (  AUGMENTIN ) 875-125 MG tablet; Take 1 tablet by mouth 2 (two) times daily for 5 days.  Dispense: 10 tablet; Refill: 0  2. Dysuria  - Culture, Urine; Future - Culture, Urine - POC Urinalysis Dipstick + leuks and nits.   Jariana Shumard, NP

## 2024-02-13 NOTE — Telephone Encounter (Signed)
 Copied from CRM #[id number]. Topic: Clinical - Request for Lab/Test Order >> Feb 13, 2024 10:01 AM Heather Rhodes wrote: Reason for CRM: Patient is calling in stating she believes she has a UTI and would like to get a urine test done, patient is asking if this can be ordered for her.    ----------------------------------------------------------------------- From previous Reason for Contact - Medical Advice: Reason for CRM:

## 2024-02-15 LAB — URINE CULTURE
MICRO NUMBER:: 16838222
SPECIMEN QUALITY:: ADEQUATE

## 2024-02-16 ENCOUNTER — Telehealth: Payer: Self-pay | Admitting: *Deleted

## 2024-02-16 NOTE — Telephone Encounter (Unsigned)
 Copied from CRM #[id number]. Topic: Clinical - Medical Advice >> Feb 16, 2024  3:23 PM Mercedes MATSU wrote: Reason for CRM: Patient called in wanting to know if she needs to call or be seen in person once she finishes her antibiotic course that was rx'd by NP Joane. Patient asking for a call back and can be reached at [phone].

## 2024-02-17 ENCOUNTER — Ambulatory Visit: Payer: Self-pay | Admitting: Adult Health

## 2024-02-18 NOTE — Telephone Encounter (Signed)
 Patient informed of the message below.

## 2024-02-18 NOTE — Telephone Encounter (Signed)
 Usually they do not need to be seen again as long as her symptoms have resolved with the medication

## 2024-02-26 DIAGNOSIS — Z961 Presence of intraocular lens: Secondary | ICD-10-CM | POA: Diagnosis not present

## 2024-02-26 DIAGNOSIS — H52203 Unspecified astigmatism, bilateral: Secondary | ICD-10-CM | POA: Diagnosis not present

## 2024-02-26 DIAGNOSIS — H04123 Dry eye syndrome of bilateral lacrimal glands: Secondary | ICD-10-CM | POA: Diagnosis not present

## 2024-03-04 DIAGNOSIS — M9903 Segmental and somatic dysfunction of lumbar region: Secondary | ICD-10-CM | POA: Diagnosis not present

## 2024-03-04 DIAGNOSIS — M9905 Segmental and somatic dysfunction of pelvic region: Secondary | ICD-10-CM | POA: Diagnosis not present

## 2024-03-04 DIAGNOSIS — M9904 Segmental and somatic dysfunction of sacral region: Secondary | ICD-10-CM | POA: Diagnosis not present

## 2024-03-04 DIAGNOSIS — M9901 Segmental and somatic dysfunction of cervical region: Secondary | ICD-10-CM | POA: Diagnosis not present

## 2024-03-25 DIAGNOSIS — M9905 Segmental and somatic dysfunction of pelvic region: Secondary | ICD-10-CM | POA: Diagnosis not present

## 2024-03-25 DIAGNOSIS — M9901 Segmental and somatic dysfunction of cervical region: Secondary | ICD-10-CM | POA: Diagnosis not present

## 2024-03-25 DIAGNOSIS — M9903 Segmental and somatic dysfunction of lumbar region: Secondary | ICD-10-CM | POA: Diagnosis not present

## 2024-03-25 DIAGNOSIS — M9904 Segmental and somatic dysfunction of sacral region: Secondary | ICD-10-CM | POA: Diagnosis not present

## 2024-04-12 ENCOUNTER — Ambulatory Visit

## 2024-04-12 VITALS — BP 120/60 | HR 62 | Temp 98.6°F | Ht 60.0 in | Wt 112.4 lb

## 2024-04-12 DIAGNOSIS — Z0000 Encounter for general adult medical examination without abnormal findings: Secondary | ICD-10-CM | POA: Diagnosis not present

## 2024-04-12 NOTE — Progress Notes (Signed)
 Subjective:   Heather Rhodes is a 89 y.o. who presents for a Medicare Wellness preventive visit.  As a reminder, Annual Wellness Visits don't include a physical exam, and some assessments may be limited, especially if this visit is performed virtually. We may recommend an in-person follow-up visit with your provider if needed.  Visit Complete: In person    Persons Participating in Visit: Patient.  AWV Questionnaire: No: Patient Medicare AWV questionnaire was not completed prior to this visit.  Cardiac Risk Factors include: advanced age (>51men, >66 women);dyslipidemia     Objective:    Today's Vitals   04/12/24 1537  BP: 120/60  Pulse: 62  Temp: 98.6 F (37 C)  TempSrc: Oral  SpO2: 93%  Weight: 112 lb 6.4 oz (51 kg)  Height: 5' (1.524 m)   Body mass index is 21.95 kg/m.     04/12/2024    3:55 PM 12/31/2023   11:22 AM 03/18/2023    3:23 PM 12/09/2018   11:26 AM 12/03/2018    2:24 PM 08/26/2018    2:54 PM  Advanced Directives  Does Patient Have a Medical Advance Directive? Yes Yes Yes Yes Yes Yes   Type of Estate agent of [place];Living will Living will  Healthcare Power of [place];Living will Healthcare Power of [place];Living will Healthcare Power of [place];Living will  Does patient want to make changes to medical advance directive? No - Patient declined No - Patient declined No - Patient declined No - Patient declined   No - Patient declined   Copy of Healthcare Power of Attorney in Chart? Yes - validated most recent copy scanned in chart (See row information)   No - copy requested   No - copy requested      Data saved with a previous flowsheet row definition    Current Medications (verified) Outpatient Encounter Medications as of 04/12/2024  Medication Sig   diclofenac  (VOLTAREN ) 75 MG EC tablet Take 1 tablet (75 mg total) by mouth 2 (two) times daily as needed.   Multiple Vitamin (MULTIVITAMIN PO) Take 1 tablet by mouth daily.    Multiple Vitamins-Minerals (ZINC PO) Take by mouth. 120mg  calcium daily/15mg  zinc daily   OVER THE COUNTER MEDICATION Alphalfa   OVER THE COUNTER MEDICATION Magnesium 200mg -potassium 99mcg daily   Phenazopyridine HCl (AZO TABS PO) Take 2 tablets by mouth daily.   Polyethyl Glycol-Propyl Glycol (SYSTANE OP) Place 1 drop into both eyes 3 (three) times daily.   VITAMIN D  PO Take 50 mcg by mouth daily.   No facility-administered encounter medications on file as of 04/12/2024.    Allergies (verified) Naproxen sodium   History: Past Medical History:  Diagnosis Date   Arthritis    Breast cancer (HCC)    2004-left breast per patient   Cancer (HCC)    breast left and endometrial cancer   Complication of anesthesia    during hysterectomy 2012 had to wake up took 3 hours and BP was low   Personal history of chemotherapy    with endometrial cancer   Pneumonia    Squamous cell carcinoma of skin    right hand scc per patient   Urinary incontinence    UTI (urinary tract infection)    Past Surgical History:  Procedure Laterality Date   ABDOMINAL HYSTERECTOMY     CARPAL TUNNEL RELEASE     Masectomy Left 2004   MASTECTOMY     left  2006   TONSILLECTOMY     1942  TOTAL HIP ARTHROPLASTY Right 12/09/2018   Procedure: RIGHT TOTAL HIP ARTHROPLASTY ANTERIOR APPROACH;  Surgeon: Melodi Lerner, MD;  Location: [facility];  Service: Orthopedics;  Laterality: Right;   TRIGGER FINGER RELEASE     Family History  Problem Relation Age of Onset   Heart attack Mother    Cancer Son    Social History   Socioeconomic History   Marital status: Divorced    Spouse name: Not on file   Number of children: Not on file   Years of education: Not on file   Highest education level: Some college, no degree  Occupational History   Not on file  Tobacco Use   Smoking status: Former    Types: Cigarettes   Smokeless tobacco: Never   Tobacco comments:    quit 1968  social smoker  Vaping Use   Vaping status:  Never Used  Substance and Sexual Activity   Alcohol use: Yes    Comment: rarely   Drug use: Never   Sexual activity: Not Currently  Other Topics Concern   Not on file  Social History Narrative   Not on file   Social Drivers of Health   Financial Resource Strain: Low Risk  (04/12/2024)   Overall Financial Resource Strain (CARDIA)    Difficulty of Paying Living Expenses: Not hard at all  Food Insecurity: No Food Insecurity (04/12/2024)   Hunger Vital Sign    Worried About Running Out of Food in the Last Year: Never true    Ran Out of Food in the Last Year: Never true  Transportation Needs: No Transportation Needs (04/12/2024)   PRAPARE - Administrator, Civil Service (Medical): No    Lack of Transportation (Non-Medical): No  Physical Activity: Insufficiently Active (04/12/2024)   Exercise Vital Sign    Days of Exercise per Week: 2 days    Minutes of Exercise per Session: 30 min  Stress: No Stress Concern Present (04/12/2024)   [employer] of Occupational Health - Occupational Stress Questionnaire    Feeling of Stress: Not at all  Social Connections: Moderately Integrated (04/12/2024)   Social Connection and Isolation Panel    Frequency of Communication with Friends and Family: More than three times a week    Frequency of Social Gatherings with Friends and Family: More than three times a week    Attends Religious Services: More than 4 times per year    Active Member of [employer] or Organizations: Yes    Attends Engineer, structural: More than 4 times per year    Marital Status: Divorced    Tobacco Counseling Counseling given: Not Answered Tobacco comments: quit 1968  social smoker    Clinical Intake:  Pre-visit preparation completed: Yes  Pain : No/denies pain     BMI - recorded: 21.95 Nutritional Status: BMI of 19-24  Normal Nutritional Risks: None Diabetes: No  Lab Results  Component Value Date   HGBA1C 5.9 12/31/2023     How often  do you need to have someone help you when you read instructions, pamphlets, or other written materials from your doctor or pharmacy?: 1 - Never  Interpreter Needed?: No  Information entered by :: Heather Blush LPN   Activities of Daily Living     04/12/2024    3:51 PM 12/31/2023   11:23 AM  In your present state of health, do you have any difficulty performing the following activities:  Hearing? 0 0  Vision? 0 0  Difficulty concentrating or making  decisions? 0 0  Walking or climbing stairs? 1 0  Comment Uses a Walker   Dressing or bathing? 0 0  Doing errands, shopping? 0 0  Preparing Food and eating ? N N  Using the Toilet? N N  In the past six months, have you accidently leaked urine? Heather Rhodes Heather Rhodes  Comment Wears pads. Followed by medical attention   Do you have problems with loss of bowel control? N N  Managing your Medications? N N  Managing your Finances? N N  Housekeeping or managing your Housekeeping? N N    Patient Care Team: Ozell Heron HERO, MD as PCP - General (Family Medicine) Livingston Rigg, MD as Consulting Physician (Dermatology)  I have updated your Care Teams any recent Medical Services you may have received from other providers in the past year.     Assessment:   This is a routine wellness examination for Heather Rhodes.  Hearing/Vision screen Hearing Screening - Comments:: Denies hearing difficulties   Vision Screening - Comments:: Wears rx glasses - up to date with routine eye exams with  Dr Charmayne   Goals Addressed               This Visit's Progress     Stay active (pt-stated)         Depression Screen     04/12/2024    3:36 PM 12/31/2023   10:59 AM 07/03/2023    1:23 PM  PHQ 2/9 Scores  PHQ - 2 Score 0 0 0  PHQ- 9 Score  0     Fall Risk     04/12/2024    3:54 PM 12/31/2023   10:59 AM 07/03/2023    1:23 PM  Fall Risk   Falls in the past year? 0 0 0  Number falls in past yr: 0 0 0  Injury with Fall? 0 0 0  Risk for fall due to : No Fall Risks  Impaired balance/gait Impaired balance/gait;Impaired mobility  Follow up Falls evaluation completed Falls evaluation completed Falls evaluation completed    MEDICARE RISK AT HOME:  Medicare Risk at Home Any stairs in or around the home?: No If so, are there any without handrails?: No Home free of loose throw rugs in walkways, pet beds, electrical cords, etc?: Yes Adequate lighting in your home to reduce risk of falls?: Yes Life alert?: No Use of a cane, walker or w/c?: Yes Grab bars in the bathroom?: Yes Shower chair or bench in shower?: No Elevated toilet seat or a handicapped toilet?: No  TIMED UP AND GO:  Was the test performed?  Yes  Length of time to ambulate 10 feet: 10 sec Gait slow and steady with assistive device  Cognitive Function: 6CIT completed        04/12/2024    3:55 PM 12/31/2023   11:27 AM  6CIT Screen  What Year? 0 points 0 points  What month? 0 points 0 points  What time? 0 points 0 points  Count back from 20 0 points 0 points  Months in reverse 0 points 0 points  Repeat phrase 0 points 0 points  Total Score 0 points 0 points    Immunizations Immunization History  Administered Date(s) Administered   Fluzone Influenza virus vaccine,trivalent (IIV3), split virus 04/05/2016, 04/10/2018   INFLUENZA, HIGH DOSE SEASONAL PF 04/05/2016, 02/23/2019, 03/27/2021, 03/13/2022   Influenza,inj,Quad PF,6+ Mos 04/05/2016, 04/10/2018   Influenza,inj,quad, With Preservative 04/10/2018   Influenza-Unspecified 04/26/2015, 04/05/2016, 04/10/2018, 02/23/2019, 04/21/2023   Moderna Sars-Covid-2 Vaccination 07/15/2019, 08/12/2019,  05/09/2020   Pfizer Covid-19 Vaccine Bivalent Booster 29yrs & up 10/15/2021   Pneumococcal Conjugate-13 04/26/2015   Pneumococcal Polysaccharide-23 04/12/2008, 03/23/2019   Pneumococcal-Unspecified 04/26/2015   Td 02/20/2005   Td (Adult),5 Lf Tetanus Toxid, Preservative Free 02/20/2005   Tdap 03/17/2018, 03/17/2018   Zoster  Recombinant(Shingrix) 08/14/2015   Zoster, Live 08/14/2015   Zoster, Unspecified 08/14/2015    Screening Tests Health Maintenance  Topic Date Due   Zoster Vaccines- Shingrix (2 of 2) 10/09/2015   Mammogram  04/10/2022   Influenza Vaccine  01/30/2024   COVID-19 Vaccine (5 - 2025-26 season) 03/01/2024   Medicare Annual Wellness (AWV)  04/12/2025   DTaP/Tdap/Td (5 - Td or Tdap) 03/17/2028   Pneumococcal Vaccine: 50+ Years  Completed   DEXA SCAN  Completed   Meningococcal B Vaccine  Aged Out    Health Maintenance Items Addressed:   Additional Screening:  Vision Screening: Recommended annual ophthalmology exams for early detection of glaucoma and other disorders of the eye. Is the patient up to date with their annual eye exam?  Yes  Who is the provider or what is the name of the office in which the patient attends annual eye exams? Dr Charmayne  Dental Screening: Recommended annual dental exams for proper oral hygiene  Community Resource Referral / Chronic Care Management: CRR required this visit?  No   CCM required this visit?  No   Plan:    I have personally reviewed and noted the following in the patient's chart:   Medical and social history Use of alcohol, tobacco or illicit drugs  Current medications and supplements including opioid prescriptions. Patient is not currently taking opioid prescriptions. Functional ability and status Nutritional status Physical activity Advanced directives List of other physicians Hospitalizations, surgeries, and ER visits in previous 12 months Vitals Screenings to include cognitive, depression, and falls Referrals and appointments  In addition, I have reviewed and discussed with patient certain preventive protocols, quality metrics, and best practice recommendations. A written personalized care plan for preventive services as well as general preventive health recommendations were provided to patient.   Heather LELON Blush,  LPN   [id number]   After Visit Summary: (In Person-Printed) AVS printed and given to the patient  Notes: Nothing significant to report at this time.

## 2024-04-12 NOTE — Patient Instructions (Addendum)
 Heather Rhodes,  Thank you for taking the time for your Medicare Wellness Visit. I appreciate your continued commitment to your health goals. Please review the care plan we discussed, and feel free to reach out if I can assist you further.  Medicare recommends these wellness visits once per year to help you and your care team stay ahead of potential health issues. These visits are designed to focus on prevention, allowing your provider to concentrate on managing your acute and chronic conditions during your regular appointments.  Please note that Annual Wellness Visits do not include a physical exam. Some assessments may be limited, especially if the visit was conducted virtually. If needed, we may recommend a separate in-person follow-up with your provider.  Ongoing Care Seeing your primary care provider every 3 to 6 months helps us  monitor your health and provide consistent, personalized care.   Referrals If a referral was made during today's visit and you haven't received any updates within two weeks, please contact the referred provider directly to check on the status.  Recommended Screenings:  Health Maintenance  Topic Date Due   Zoster (Shingles) Vaccine (2 of 2) 10/09/2015   Breast Cancer Screening  04/10/2022   COVID-19 Vaccine (5 - 2025-26 season) 03/01/2024   Medicare Annual Wellness Visit  04/12/2025   DTaP/Tdap/Td vaccine (5 - Td or Tdap) 03/17/2028   Pneumococcal Vaccine for age over 71  Completed   Flu Shot  Completed   DEXA scan (bone density measurement)  Completed   Meningitis B Vaccine  Aged Out       04/12/2024    3:55 PM  Advanced Directives  Does Patient Have a Medical Advance Directive? Yes  Type of Estate agent of [place];Living will  Does patient want to make changes to medical advance directive? No - Patient declined  Copy of Healthcare Power of Attorney in Chart? Yes - validated most recent copy scanned in chart (See row information)    Advance Care Planning is important because it: Ensures you receive medical care that aligns with your values, goals, and preferences. Provides guidance to your family and loved ones, reducing the emotional burden of decision-making during critical moments.  Vision: Annual vision screenings are recommended for early detection of glaucoma, cataracts, and diabetic retinopathy. These exams can also reveal signs of chronic conditions such as diabetes and high blood pressure.  Dental: Annual dental screenings help detect early signs of oral cancer, gum disease, and other conditions linked to overall health, including heart disease and diabetes.  Please see the attached documents for additional preventive care recommendations.

## 2024-04-15 DIAGNOSIS — M9901 Segmental and somatic dysfunction of cervical region: Secondary | ICD-10-CM | POA: Diagnosis not present

## 2024-04-15 DIAGNOSIS — M9905 Segmental and somatic dysfunction of pelvic region: Secondary | ICD-10-CM | POA: Diagnosis not present

## 2024-04-15 DIAGNOSIS — M545 Low back pain, unspecified: Secondary | ICD-10-CM | POA: Diagnosis not present

## 2024-04-15 DIAGNOSIS — M9903 Segmental and somatic dysfunction of lumbar region: Secondary | ICD-10-CM | POA: Diagnosis not present

## 2024-04-15 DIAGNOSIS — M9902 Segmental and somatic dysfunction of thoracic region: Secondary | ICD-10-CM | POA: Diagnosis not present

## 2024-04-15 DIAGNOSIS — M9904 Segmental and somatic dysfunction of sacral region: Secondary | ICD-10-CM | POA: Diagnosis not present

## 2024-04-15 DIAGNOSIS — M62830 Muscle spasm of back: Secondary | ICD-10-CM | POA: Diagnosis not present

## 2024-04-19 ENCOUNTER — Ambulatory Visit: Admitting: Podiatry

## 2024-04-20 ENCOUNTER — Ambulatory Visit: Admitting: Podiatry

## 2024-05-05 ENCOUNTER — Other Ambulatory Visit: Payer: Self-pay | Admitting: Family Medicine

## 2024-05-05 DIAGNOSIS — M19041 Primary osteoarthritis, right hand: Secondary | ICD-10-CM

## 2024-05-06 DIAGNOSIS — M62830 Muscle spasm of back: Secondary | ICD-10-CM | POA: Diagnosis not present

## 2024-05-06 DIAGNOSIS — M545 Low back pain, unspecified: Secondary | ICD-10-CM | POA: Diagnosis not present

## 2024-05-06 DIAGNOSIS — M9905 Segmental and somatic dysfunction of pelvic region: Secondary | ICD-10-CM | POA: Diagnosis not present

## 2024-05-06 DIAGNOSIS — M9904 Segmental and somatic dysfunction of sacral region: Secondary | ICD-10-CM | POA: Diagnosis not present

## 2024-05-06 DIAGNOSIS — M9901 Segmental and somatic dysfunction of cervical region: Secondary | ICD-10-CM | POA: Diagnosis not present

## 2024-05-06 DIAGNOSIS — M9902 Segmental and somatic dysfunction of thoracic region: Secondary | ICD-10-CM | POA: Diagnosis not present

## 2024-05-06 DIAGNOSIS — M9903 Segmental and somatic dysfunction of lumbar region: Secondary | ICD-10-CM | POA: Diagnosis not present

## 2024-05-10 ENCOUNTER — Ambulatory Visit: Admitting: Podiatry

## 2024-05-10 ENCOUNTER — Ambulatory Visit

## 2024-05-10 VITALS — Ht 60.0 in | Wt 112.4 lb

## 2024-05-10 DIAGNOSIS — M19071 Primary osteoarthritis, right ankle and foot: Secondary | ICD-10-CM

## 2024-05-10 DIAGNOSIS — M7751 Other enthesopathy of right foot: Secondary | ICD-10-CM

## 2024-05-10 NOTE — Progress Notes (Unsigned)
 Subjective:   Patient ID: Heather Rhodes, female   DOB: 89 y.o.   MRN: [id number]   HPI Chief Complaint  Patient presents with   Ankle Pain    RM 11 Patient is here for chronic swelling and right ankle pain.    89 year old female presents the office with above concerns.  She said that she has ongoing chronic discomfort, swelling and pain to the right ankle.  She was last seen in 2024 she had a steroid injection but she does not want to have one today.  She states that hurts to walk at times.  She does not report any recent injuries but she does have a remote history of fracture.   Review of Systems  All other systems reviewed and are negative.       Objective:  Physical Exam  General: AAO x3, NAD  Dermatological: Skin is warm, dry and supple bilateral. There are no open sores, no preulcerative lesions, no rash or signs of infection present.  Vascular: Dorsalis Pedis artery and Posterior Tibial artery pedal pulses are 2/4 bilateral with immedate capillary fill time. There is no pain with calf compression, swelling, warmth, erythema.   Neruologic: Grossly intact via light touch bilateral.   Musculoskeletal: Not able to appreciate any area pinpoint tenderness.  She does get discomfort on the ankle joint itself.  Range of motion intact but there is crepitus noted with range of motion.  There is localized edema to the ankle but there is no erythema or warmth.  There is no pain at the foot.       Assessment:   Significant arthritis right ankle     Plan:  -Treatment options discussed including all alternatives, risks, and complications -Etiology of symptoms were discussed - X-rays obtained reviewed.  There is no evidence of acute fracture.  Significant arthritic changes present to the ankle.  Arthritic changes present at the first MTPJ. Osteopenia.  - Offered steroid injection today.  Discussed with her long-term given the arthritis we discussed different bracing options.   Tri-Lock ankle brace was dispensed to use as needed.  If not helpful we can consider further options for braces. -Discussed low-dose radiation if needed I gave her information on this.  No follow-ups on file.  Donnice JONELLE Fees DPM

## 2024-05-10 NOTE — Patient Instructions (Signed)
For instructions on how to put on your Tri-Lock Ankle Brace, please visit [email or web address] 

## 2024-06-14 ENCOUNTER — Ambulatory Visit: Payer: Self-pay

## 2024-06-14 NOTE — Telephone Encounter (Signed)
 Noted- ok to close.

## 2024-06-14 NOTE — Telephone Encounter (Signed)
 FYI Only or Action Required?: FYI only for provider: appointment scheduled on tomorrow.  Patient was last seen in primary care on 02/13/2024 by Merna Huxley, NP.  Called Nurse Triage reporting Nausea.  Symptoms began several weeks ago.  Interventions attempted: Nothing.  Symptoms are: unchanged.  Triage Disposition: See PCP When Office is Open (Within 3 Days)  Patient/caregiver understands and will follow disposition?: Yes, will follow disposition  Summary: Neausea   Reason for Triage: Patient has had an upset stomach for 2 weeks has had neausea on and off, no ambition to do anything , also has numbness and pinching in right arm when sitting wrong in the chair         Reason for Disposition  Nausea lasts > 1 week  Answer Assessment - Initial Assessment Questions 1. NAUSEA SEVERITY: How bad is the nausea? (e.g., mild, moderate, severe; dehydration, weight loss)     annoying 2. ONSET: When did the nausea begin?     2 weeks ago 3. VOMITING: Any vomiting? If Yes, ask: How many times today?     denies 4. RECURRENT SYMPTOM: Have you had nausea before? If Yes, ask: When was the last time? What happened that time?     denies 5. CAUSE: What do you think is causing the nausea?     Unsure, started after thanksgiving  Denies new medications. Denies changes in GI. Denies SOB, denies dizziness.  Protocols used: Nausea-A-AH

## 2024-06-14 NOTE — Telephone Encounter (Signed)
 Appt scheduled with Dr Ozell on 12/16.

## 2024-06-15 ENCOUNTER — Ambulatory Visit: Admitting: Family Medicine

## 2024-06-15 ENCOUNTER — Ambulatory Visit: Payer: Self-pay | Admitting: Family Medicine

## 2024-06-15 ENCOUNTER — Encounter: Payer: Self-pay | Admitting: Family Medicine

## 2024-06-15 VITALS — BP 120/62 | HR 111 | Temp 97.4°F | Ht 60.0 in | Wt 112.2 lb

## 2024-06-15 DIAGNOSIS — R110 Nausea: Secondary | ICD-10-CM

## 2024-06-15 DIAGNOSIS — K858 Other acute pancreatitis without necrosis or infection: Secondary | ICD-10-CM

## 2024-06-15 DIAGNOSIS — M4726 Other spondylosis with radiculopathy, lumbar region: Secondary | ICD-10-CM

## 2024-06-15 LAB — COMPREHENSIVE METABOLIC PANEL WITH GFR
ALT: 23 U/L (ref 3–35)
AST: 29 U/L (ref 5–37)
Albumin: 3.8 g/dL (ref 3.5–5.2)
Alkaline Phosphatase: 94 U/L (ref 39–117)
BUN: 19 mg/dL (ref 6–23)
CO2: 26 meq/L (ref 19–32)
Calcium: 9.2 mg/dL (ref 8.4–10.5)
Chloride: 106 meq/L (ref 96–112)
Creatinine, Ser: 0.79 mg/dL (ref 0.40–1.20)
GFR: 65.06 mL/min (ref >60.00)
Glucose, Bld: 105 mg/dL — ABNORMAL HIGH (ref 70–99)
Potassium: 4.4 meq/L (ref 3.5–5.1)
Sodium: 138 meq/L (ref 135–145)
Total Bilirubin: 0.3 mg/dL (ref 0.2–1.2)
Total Protein: 6.7 g/dL (ref 6.0–8.3)

## 2024-06-15 LAB — LIPASE: Lipase: 98 U/L — ABNORMAL HIGH (ref 11.0–59.0)

## 2024-06-15 MED ORDER — FAMOTIDINE 20 MG PO TABS: 20.0000 mg | ORAL_TABLET | Freq: Two times a day (BID) | ORAL | 1 refills | Status: AC

## 2024-06-15 MED ORDER — ONDANSETRON 4 MG PO TBDP: 4.0000 mg | ORAL_TABLET | Freq: Three times a day (TID) | ORAL | 2 refills | Status: DC | PRN

## 2024-06-15 NOTE — Progress Notes (Signed)
 Established Patient Office Visit  Subjective   Patient ID: Heather Rhodes, female    DOB: [birth year]  Age: 89 y.o. MRN: [id number]  Chief Complaint  Patient presents with   Nausea    Intermittently x2 weeks, denies vomiting    HPI Discussed the use of AI scribe software for clinical note transcription with the patient, who gave verbal consent to proceed.  History of Present Illness   Heather Rhodes is a 89 year old female who presents with persistent nausea since two days after Thanksgiving.  She has had persistent nausea beginning two days after Thanksgiving. It is worse when she is hungry and improves with eating. She denies vomiting, appetite change, weight loss, abdominal pain, fever, chills, chest pain, reflux or heartburn, or change in bowel or urinary habits. She has regular, well-formed bowel movements every couple of days and notes only occasional burping.  She feels blah with low motivation for usual activities like baking but denies sadness, hopelessness, or frequent crying. Social interaction improves her mood, but she has been going out less and feels this may relate to reduced exercise.  Her sleep is generally adequate. She may wake for about an hour at night but falls back asleep and feels rested in the morning. She notes exercise helps her sleep.  She lives at [facility]. She has not been drinking enough water  and stopped drinking the green tea she previously used for upset stomach.       Current Outpatient Medications  Medication Instructions   diclofenac  (VOLTAREN ) 75 MG EC tablet TAKE 1 TABLET BY MOUTH 2 TIMES DAILY AS NEEDED.   famotidine  (PEPCID ) 20 mg, Oral, 2 times daily   Multiple Vitamin (MULTIVITAMIN PO) 1 tablet, Daily   Multiple Vitamins-Minerals (ZINC PO) Take by mouth. 120mg  calcium daily/15mg  zinc daily   OVER THE COUNTER MEDICATION Alphalfa   OVER THE COUNTER MEDICATION Magnesium 200mg -potassium 99mcg daily    Phenazopyridine HCl (AZO TABS PO) 2 tablets, Daily   Polyethyl Glycol-Propyl Glycol (SYSTANE OP) 1 drop, 3 times daily   VITAMIN D  PO 50 mcg, Daily    Patient Active Problem List   Diagnosis Date Noted   Chronic pain of both ankles 11/02/2019   History of total right hip replacement 12/21/2018   OA (osteoarthritis) of hip 12/09/2018   Pain of both hip joints 05/22/2018   Hyperlipidemia LDL goal <130 03/01/2016   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 04/11/2015   Lumbar degenerative disc disease 12/27/2014   Osteoarthritis of spine with radiculopathy, lumbar region 12/27/2014   Spinal stenosis of lumbar region 11/29/2014   Atrophic vaginitis 04/22/2014   Carpal tunnel syndrome 04/22/2014   Dyslipidemia 04/22/2014   Dysuria 04/22/2014   Ganglion of right knee 04/22/2014   Gout 04/22/2014   Increased frequency of urination 04/22/2014   Iron deficiency 04/22/2014   Iron deficiency anemia due to chronic blood loss 04/22/2014   Lung infiltrate on CT 04/22/2014   Prepatellar bursitis 04/22/2014   Senile osteoporosis 04/22/2014   Urinary tract infection 04/22/2014   Vitamin D  deficiency 04/22/2014   Personal history of malignant neoplasm of other parts of uterus 06/26/2011     Review of Systems  All other systems reviewed and are negative.     Objective:     BP 120/62   Pulse (!) 111   Temp (!) 97.4 F (36.3 C) (Oral)   Ht 5' (1.524 m)   Wt 112 lb 3.2 oz (50.9 kg)   SpO2 98%   BMI  21.91 kg/m    Physical Exam Vitals reviewed.  Constitutional:      Appearance: Normal appearance. She is normal weight.  Abdominal:     General: Bowel sounds are normal.     Palpations: Abdomen is soft.  Neurological:     Mental Status: She is alert and oriented to person, place, and time. Mental status is at baseline.  Psychiatric:        Mood and Affect: Mood normal.      No results found for any visits on 06/15/24.    The ASCVD Risk score (Arnett DK, et al., 2019) failed to calculate for  the following reasons:   The 2019 ASCVD risk score is only valid for ages 90 to 50   * - Cholesterol units were assumed    Assessment & Plan:  Nausea -     Comprehensive metabolic panel with GFR; Future -     Lipase; Future -     Famotidine ; Take 1 tablet (20 mg total) by mouth 2 (two) times daily.  Dispense: 60 tablet; Refill: 1   Assessment and Plan    Gastritis and gastroesophageal reflux disease Intermittent nausea since after Thanksgiving, primarily when hungry, with no vomiting, weight loss, or significant changes in bowel movements. No abdominal pain, fever, or chills. Differential includes gallbladder issues, acid reflux, and gastritis. Likely related to acid reflux or gastritis, possibly exacerbated by a partial hiatal hernia. No red flag symptoms present. - Ordered comprehensive metabolic panel (CMP) to assess liver function and dehydration status. - Ordered lipase level to evaluate pancreatic function. - Prescribed famotidine  20 mg twice daily for acid reflux. - Will consider abdominal x-ray if symptoms persist to evaluate bowel gas pattern and rule out constipation. - Will consider imaging (CT scan or ultrasound) if nausea does not improve with treatment.  Low motivation and anhedonia Reports low motivation and anhedonia, possibly related to seasonal changes. No significant mood changes or depressive symptoms. Sleep is generally adequate with occasional nighttime awakenings. Exercise and sunlight exposure may help improve symptoms. - Encouraged regular exercise to improve mood and energy levels. - Advised spending time in sunlight to help with mood. - Referred to physical therapy for posture and exercise guidance.  General Health Maintenance Routine follow-up and monitoring of symptoms. - Scheduled follow-up appointment in January for six-month check-up and symptom evaluation.        No follow-ups on file.    Heron CHRISTELLA Sharper, MD

## 2024-06-16 ENCOUNTER — Telehealth: Payer: Self-pay | Admitting: *Deleted

## 2024-06-16 NOTE — Telephone Encounter (Signed)
 Noted

## 2024-06-16 NOTE — Telephone Encounter (Signed)
 Copied from CRM #[id number]. Topic: General - Other >> Jun 15, 2024  5:02 PM Kevelyn M wrote: Reason for CRM: Patient calling back for [place], please give her call back. Read Dr. Heddie message to the patient.  Call back # [phone]

## 2024-06-16 NOTE — Telephone Encounter (Signed)
 Copied from CRM [id number]. Topic: Clinical - Lab/Test Results >> Jun 16, 2024  2:48 PM Alexandria E wrote: Reason for CRM: Patient returning call from [place] about labs. Relayed note that PCP attached with results and patient did not have any further questions. She already got her CT scheduled for 12/26.

## 2024-06-16 NOTE — Telephone Encounter (Signed)
 See results note.

## 2024-06-25 ENCOUNTER — Ambulatory Visit
Admission: RE | Admit: 2024-06-25 | Discharge: 2024-06-25 | Disposition: A | Discharge status: Home / Self Care | Source: Ambulatory Visit | Attending: Family Medicine | Admitting: Family Medicine

## 2024-06-25 DIAGNOSIS — K858 Other acute pancreatitis without necrosis or infection: Secondary | ICD-10-CM

## 2024-06-25 DIAGNOSIS — R110 Nausea: Secondary | ICD-10-CM

## 2024-06-25 MED ORDER — IOPAMIDOL (ISOVUE-300) INJECTION 61%
80.0000 mL | Freq: Once | INTRAVENOUS | Status: AC | PRN
  Administered 2024-06-25: 80 mL via INTRAVENOUS

## 2024-07-05 ENCOUNTER — Ambulatory Visit: Payer: Self-pay | Admitting: Family Medicine

## 2024-07-06 ENCOUNTER — Telehealth: Payer: Self-pay

## 2024-07-06 NOTE — Telephone Encounter (Signed)
 Copied from CRM [id number]. Topic: Clinical - Lab/Test Results >> Jul 06, 2024 10:26 AM Rea ORN wrote: Reason for CRM: Pt received MyChart message alert that she had results in. PCP sent message regarding CT scan. I am unable to relay what this message says from PCP due to it being test results.   Please call back to advise, [phone]

## 2024-07-07 NOTE — Telephone Encounter (Signed)
 Patient was informed of the results of the CT scan and will discuss this during next office visit.

## 2024-07-15 ENCOUNTER — Encounter: Payer: Self-pay | Admitting: Family Medicine

## 2024-07-15 ENCOUNTER — Ambulatory Visit: Admitting: Family Medicine

## 2024-07-15 VITALS — BP 132/70 | HR 87 | Temp 97.5°F | Ht 60.0 in | Wt 110.2 lb

## 2024-07-15 DIAGNOSIS — K449 Diaphragmatic hernia without obstruction or gangrene: Secondary | ICD-10-CM | POA: Diagnosis not present

## 2024-07-15 DIAGNOSIS — K5900 Constipation, unspecified: Secondary | ICD-10-CM | POA: Diagnosis not present

## 2024-07-15 DIAGNOSIS — R4584 Anhedonia: Secondary | ICD-10-CM | POA: Diagnosis not present

## 2024-07-15 NOTE — Progress Notes (Signed)
 "  Established Patient Office Visit  Subjective   Patient ID: Heather Rhodes, female    DOB: 11/01/1929  Age: 89 y.o. MRN: [id number]  Chief Complaint  Patient presents with   Medical Management of Chronic Issues    HPI Discussed the use of AI scribe software for clinical note transcription with the patient, who gave verbal consent to proceed.  History of Present Illness   Heather Rhodes is a 89 year old female who presents for a follow-up visit after a CT scan and to discuss her symptoms and mood.  A CT scan on December 26 showed a large stool burden and a hiatal hernia with stomach and colon in the chest cavity. Her bowel symptoms have improved and she no longer feels constipated. She has known hiatal hernia and currently feels well without symptoms when lying flat.  She was prescribed famotidine  for prior ulcer symptoms, which have resolved. She has difficulty opening the childproof medication bottle.  She has felt her mood was terrible, related to driving someone else, but her mood and motivation have improved since deciding to stop driving for others.  She reports a pinched nerve in her arm that is gradually improving. She uses a cane for support and is trying to reduce its use.  Recent labs showed slightly elevated lipase with normal-appearing pancreas on CT. Kidney and liver function are good, and blood sugar is slightly elevated.       Current Outpatient Medications  Medication Instructions   diclofenac  (VOLTAREN ) 75 MG EC tablet TAKE 1 TABLET BY MOUTH 2 TIMES DAILY AS NEEDED.   famotidine  (PEPCID ) 20 mg, Oral, 2 times daily   Multiple Vitamin (MULTIVITAMIN PO) 1 tablet, Daily   Multiple Vitamins-Minerals (ZINC PO) Take by mouth. 120mg  calcium daily/15mg  zinc daily   ondansetron  (ZOFRAN -ODT) 4 mg, Oral, Every 8 hours PRN   OVER THE COUNTER MEDICATION Alphalfa   OVER THE COUNTER MEDICATION Magnesium 200mg -potassium 99mcg daily   Phenazopyridine HCl (AZO TABS PO) 2  tablets, Daily   Polyethyl Glycol-Propyl Glycol (SYSTANE OP) 1 drop, 3 times daily   VITAMIN D  PO 50 mcg, Daily    Patient Active Problem List   Diagnosis Date Noted   Chronic pain of both ankles 11/02/2019   History of total right hip replacement 12/21/2018   OA (osteoarthritis) of hip 12/09/2018   Pain of both hip joints 05/22/2018   Hyperlipidemia LDL goal <130 03/01/2016   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 04/11/2015   Lumbar degenerative disc disease 12/27/2014   Osteoarthritis of spine with radiculopathy, lumbar region 12/27/2014   Spinal stenosis of lumbar region 11/29/2014   Atrophic vaginitis 04/22/2014   Carpal tunnel syndrome 04/22/2014   Dyslipidemia 04/22/2014   Dysuria 04/22/2014   Ganglion of right knee 04/22/2014   Gout 04/22/2014   Increased frequency of urination 04/22/2014   Iron deficiency 04/22/2014   Iron deficiency anemia due to chronic blood loss 04/22/2014   Lung infiltrate on CT 04/22/2014   Prepatellar bursitis 04/22/2014   Senile osteoporosis 04/22/2014   Urinary tract infection 04/22/2014   Vitamin D  deficiency 04/22/2014   Personal history of malignant neoplasm of other parts of uterus 06/26/2011     Review of Systems  All other systems reviewed and are negative.     Objective:     BP 132/70   Pulse 87   Temp (!) 97.5 F (36.4 C) (Oral)   Ht 5' (1.524 m)   Wt 110 lb 3.2 oz (50 kg)   SpO2  97%   BMI 21.52 kg/m    Physical Exam Vitals reviewed.  Constitutional:      Appearance: Normal appearance. She is well-groomed and normal weight.  Eyes:     Conjunctiva/sclera: Conjunctivae normal.  Neck:     Thyroid: No thyromegaly.  Cardiovascular:     Rate and Rhythm: Normal rate and regular rhythm.     Pulses: Normal pulses.     Heart sounds: S1 normal and S2 normal.  Pulmonary:     Effort: Pulmonary effort is normal.     Breath sounds: Normal breath sounds and air entry.  Abdominal:     General: Bowel sounds are normal.  Musculoskeletal:      Right lower leg: No edema.     Left lower leg: No edema.  Neurological:     Mental Status: She is alert and oriented to person, place, and time. Mental status is at baseline.     Gait: Gait is intact.  Psychiatric:        Mood and Affect: Mood and affect normal.        Speech: Speech normal.        Behavior: Behavior normal.        Judgment: Judgment normal.      No results found for any visits on 07/15/24.    The ASCVD Risk score (Arnett DK, et al., 2019) failed to calculate for the following reasons:   The 2019 ASCVD risk score is only valid for ages 23 to 6   * - Cholesterol units were assumed    Assessment & Plan:  Acute constipation  Hiatal hernia  Anhedonia   Assessment and Plan    Acute constipation Resolved with increased bowel movements. CT scan revealed a large amount of stool in the colon, likely due to a hiatal hernia causing partial obstruction. - Ensure adequate bowel movements to prevent recurrence.  Hiatal hernia Large hiatal hernia with stomach and part of the colon protruding into the chest cavity. Symptoms have resolved, and surgical intervention is not necessary due to age and lack of significant symptoms. - Will refer to GI specialist if symptoms return for further evaluation and possible endoscopy.  Anhedonia Mood has improved with the decision to stop driving for others, reducing stress and improving overall mood. No current need for medication as symptoms are improving. - Continue to monitor mood and report any return of depressive symptoms. - Encouraged outdoor activities and sunlight exposure to improve mood.        Return in about 6 months (around 01/12/2025) for annual physical exam.    Heron CHRISTELLA Sharper, MD "

## 2024-07-17 ENCOUNTER — Other Ambulatory Visit: Payer: Self-pay

## 2024-07-17 ENCOUNTER — Observation Stay (HOSPITAL_COMMUNITY)

## 2024-07-17 ENCOUNTER — Observation Stay (HOSPITAL_COMMUNITY)
Admission: EM | Admit: 2024-07-17 | Discharge: 2024-07-18 | Disposition: A | Attending: Internal Medicine | Admitting: Internal Medicine

## 2024-07-17 DIAGNOSIS — Z9221 Personal history of antineoplastic chemotherapy: Secondary | ICD-10-CM | POA: Insufficient documentation

## 2024-07-17 DIAGNOSIS — Z8542 Personal history of malignant neoplasm of other parts of uterus: Secondary | ICD-10-CM | POA: Insufficient documentation

## 2024-07-17 DIAGNOSIS — E860 Dehydration: Secondary | ICD-10-CM | POA: Insufficient documentation

## 2024-07-17 DIAGNOSIS — N390 Urinary tract infection, site not specified: Secondary | ICD-10-CM | POA: Diagnosis present

## 2024-07-17 DIAGNOSIS — N3090 Cystitis, unspecified without hematuria: Secondary | ICD-10-CM | POA: Diagnosis not present

## 2024-07-17 DIAGNOSIS — K5900 Constipation, unspecified: Secondary | ICD-10-CM | POA: Diagnosis not present

## 2024-07-17 DIAGNOSIS — U071 COVID-19: Principal | ICD-10-CM | POA: Diagnosis present

## 2024-07-17 DIAGNOSIS — N3000 Acute cystitis without hematuria: Secondary | ICD-10-CM | POA: Diagnosis not present

## 2024-07-17 DIAGNOSIS — M6281 Muscle weakness (generalized): Secondary | ICD-10-CM | POA: Insufficient documentation

## 2024-07-17 DIAGNOSIS — R5381 Other malaise: Secondary | ICD-10-CM | POA: Diagnosis present

## 2024-07-17 DIAGNOSIS — Z87891 Personal history of nicotine dependence: Secondary | ICD-10-CM | POA: Diagnosis not present

## 2024-07-17 DIAGNOSIS — R7881 Bacteremia: Secondary | ICD-10-CM | POA: Diagnosis not present

## 2024-07-17 DIAGNOSIS — Z853 Personal history of malignant neoplasm of breast: Secondary | ICD-10-CM | POA: Insufficient documentation

## 2024-07-17 DIAGNOSIS — Z85828 Personal history of other malignant neoplasm of skin: Secondary | ICD-10-CM | POA: Insufficient documentation

## 2024-07-17 DIAGNOSIS — R531 Weakness: Secondary | ICD-10-CM | POA: Diagnosis present

## 2024-07-17 LAB — COMPREHENSIVE METABOLIC PANEL WITH GFR
ALT: 19 U/L (ref 0–44)
AST: 28 U/L (ref 15–41)
Albumin: 3.9 g/dL (ref 3.5–5.0)
Alkaline Phosphatase: 85 U/L (ref 38–126)
Anion gap: 10 (ref 5–15)
BUN: 24 mg/dL — ABNORMAL HIGH (ref 8–23)
CO2: 23 mmol/L (ref 22–32)
Calcium: 9.1 mg/dL (ref 8.9–10.3)
Chloride: 104 mmol/L (ref 98–111)
Creatinine, Ser: 0.91 mg/dL (ref 0.44–1.00)
GFR, Estimated: 59 mL/min — ABNORMAL LOW
Glucose, Bld: 110 mg/dL — ABNORMAL HIGH (ref 70–99)
Potassium: 4.3 mmol/L (ref 3.5–5.1)
Sodium: 137 mmol/L (ref 135–145)
Total Bilirubin: <0.2 mg/dL (ref 0.0–1.2)
Total Protein: 6.7 g/dL (ref 6.5–8.1)

## 2024-07-17 LAB — URINALYSIS, W/ REFLEX TO CULTURE (INFECTION SUSPECTED)
Bilirubin Urine: NEGATIVE
Glucose, UA: NEGATIVE mg/dL
Hgb urine dipstick: NEGATIVE
Ketones, ur: NEGATIVE mg/dL
Nitrite: POSITIVE — AB
Protein, ur: 30 mg/dL — AB
Specific Gravity, Urine: 1.02 (ref 1.005–1.030)
pH: 5 (ref 5.0–8.0)

## 2024-07-17 LAB — CBC WITH DIFFERENTIAL/PLATELET
Abs Immature Granulocytes: 0.02 K/uL (ref 0.00–0.07)
Basophils Absolute: 0.1 K/uL (ref 0.0–0.1)
Basophils Relative: 1 %
Eosinophils Absolute: 0 K/uL (ref 0.0–0.5)
Eosinophils Relative: 1 %
HCT: 36.9 % (ref 36.0–46.0)
Hemoglobin: 12.3 g/dL (ref 12.0–15.0)
Immature Granulocytes: 0 %
Lymphocytes Relative: 8 %
Lymphs Abs: 0.5 K/uL — ABNORMAL LOW (ref 0.7–4.0)
MCH: 32.2 pg (ref 26.0–34.0)
MCHC: 33.3 g/dL (ref 30.0–36.0)
MCV: 96.6 fL (ref 80.0–100.0)
Monocytes Absolute: 1.1 K/uL — ABNORMAL HIGH (ref 0.1–1.0)
Monocytes Relative: 16 %
Neutro Abs: 4.8 K/uL (ref 1.7–7.7)
Neutrophils Relative %: 74 %
Platelets: 233 K/uL (ref 150–400)
RBC: 3.82 MIL/uL — ABNORMAL LOW (ref 3.87–5.11)
RDW: 12.9 % (ref 11.5–15.5)
WBC: 6.6 K/uL (ref 4.0–10.5)
nRBC: 0 % (ref 0.0–0.2)

## 2024-07-17 LAB — CK: Total CK: 144 U/L (ref 38–234)

## 2024-07-17 LAB — RESP PANEL BY RT-PCR (RSV, FLU A&B, COVID)  RVPGX2
Influenza A by PCR: NEGATIVE
Influenza B by PCR: NEGATIVE
Resp Syncytial Virus by PCR: NEGATIVE
SARS Coronavirus 2 by RT PCR: POSITIVE — AB

## 2024-07-17 MED ORDER — SODIUM CHLORIDE 0.9 % IV SOLN
1.0000 g | Freq: Once | INTRAVENOUS | Status: AC
  Administered 2024-07-17: 1 g via INTRAVENOUS
  Filled 2024-07-17: qty 10

## 2024-07-17 MED ORDER — SODIUM CHLORIDE 0.9 % IV SOLN: 1.0000 g | INTRAVENOUS | Status: DC

## 2024-07-17 MED ORDER — LACTATED RINGERS IV BOLUS
500.0000 mL | Freq: Once | INTRAVENOUS | Status: AC
  Administered 2024-07-18: 500 mL via INTRAVENOUS

## 2024-07-17 MED ORDER — LACTATED RINGERS IV BOLUS
500.0000 mL | Freq: Once | INTRAVENOUS | Status: AC
  Administered 2024-07-17: 500 mL via INTRAVENOUS

## 2024-07-17 NOTE — Assessment & Plan Note (Signed)
Will need PT OT assessment prior to discharge  

## 2024-07-17 NOTE — Assessment & Plan Note (Signed)
-   treat with Rocephin         await results of urine culture and adjust antibiotic coverage as needed  

## 2024-07-17 NOTE — Assessment & Plan Note (Signed)
 Likely contributing to generalized fatigue Denies any cough no fevers no chills no respiratory complaints No oxygen requirement Lobe incidental finding will continue to monitor supportive management

## 2024-07-17 NOTE — ED Provider Notes (Signed)
 " [place] EMERGENCY DEPARTMENT AT [facility] Provider Note   CSN: [id number] Arrival date & time: 07/17/24  2036     History Chief Complaint  Patient presents with   Extremity Weakness    legs    HPI: Heather Rhodes is a 89 y.o. female with history pertinent for osteoarthritis, dyslipidemia, spinal stenosis who presents complaining of generalized weakness. Patient arrived via EMS from assisted living at [place].  History provided by patient.  No interpreter required during this encounter.  Patient reports that she has had progressive generalized weakness for approximately 2 days.  Reports that she otherwise has been in her normal state of health, does not believe that she has any known sick contacts, denies any fever, chills, chest pain, shortness of breath, nausea, vomiting, diarrhea, dysuria.  Reports that she began having malaise myalgias beginning yesterday, however today felt worsened and was unable to stand up on her own, therefore she called EMS to help her get up.  Did not have any recent trauma or falls, does not describe any weakness of the upper extremities, states that symptoms are equal bilaterally, no facial droop/weakness.  Patient does report that she has had poor oral intake of both food and water  over the past several days which she attributes to lack of appetite.  Patient's recorded medical, surgical, social, medication list and allergies were reviewed in the Snapshot window as part of the initial history.   Prior to Admission medications  Medication Sig Start Date End Date Taking? Authorizing Provider  ascorbic acid (VITAMIN C) 500 MG tablet Take 500 mg by mouth daily.   Yes [provider]  diclofenac  (VOLTAREN ) 75 MG EC tablet TAKE 1 TABLET BY MOUTH 2 TIMES DAILY AS NEEDED. 05/05/24  Yes Ozell Heron HERO, MD  famotidine  (PEPCID ) 20 MG tablet Take 1 tablet (20 mg total) by mouth 2 (two) times daily. Patient taking differently: Take 20 mg by  mouth 2 (two) times daily as needed. 06/15/24  Yes Ozell Heron HERO, MD  Multiple Vitamin (MULTIVITAMIN PO) Take 1 tablet by mouth daily.   Yes [provider]  Multiple Vitamins-Minerals (ZINC PO) Take 1 tablet by mouth daily. 120mg  calcium daily/15mg  zinc daily   Yes [provider]  OVER THE COUNTER MEDICATION Take 1 tablet by mouth daily. Magnesium 200mg -potassium 99mcg daily   Yes [provider]  Polyethyl Glycol-Propyl Glycol (SYSTANE OP) Place 1 drop into both eyes 3 (three) times daily.   Yes [provider]  VITAMIN D  PO Take 50 mcg by mouth daily.   Yes [provider]  ondansetron  (ZOFRAN -ODT) 4 MG disintegrating tablet Take 1 tablet (4 mg total) by mouth every 8 (eight) hours as needed for nausea or vomiting. Patient not taking: Reported on 07/17/2024 06/15/24   Ozell Heron HERO, MD     Allergies: Naproxen sodium   Review of Systems   ROS as per HPI  Physical Exam Updated Vital Signs BP 120/74 (BP Location: Right Arm)   Pulse 90   Temp 99.3 F (37.4 C) (Oral)   Resp 17   Ht 5' (1.524 m)   Wt 50.8 kg   SpO2 97%   BMI 21.87 kg/m  Physical Exam Vitals and nursing note reviewed.  Constitutional:      General: She is not in acute distress.    Appearance: She is well-developed.  HENT:     Head: Normocephalic and atraumatic.  Eyes:     Conjunctiva/sclera: Conjunctivae normal.  Cardiovascular:  Rate and Rhythm: Normal rate and regular rhythm.     Heart sounds: No murmur heard. Pulmonary:     Effort: Pulmonary effort is normal. No respiratory distress.     Breath sounds: Normal breath sounds.  Abdominal:     Palpations: Abdomen is soft.     Tenderness: There is no abdominal tenderness.  Musculoskeletal:        General: No swelling.     Cervical back: Neck supple.  Skin:    General: Skin is warm and dry.     Capillary Refill: Capillary refill takes less than 2 seconds.  Neurological:     Mental Status: She is  alert.     Motor: Weakness (Globally 4+/5 in grip, biceps, triceps, hip flexors, hip extensors, knee flexors, knee extensors, dorsiflexion, plantarflexion) present.  Psychiatric:        Mood and Affect: Mood normal.     ED Course/ Medical Decision Making/ A&P    Procedures Procedures   Medications Ordered in ED Medications  cefTRIAXone  (ROCEPHIN ) 1 g in sodium chloride  0.9 % 100 mL IVPB (has no administration in time range)  lactated ringers  bolus 500 mL (has no administration in time range)  lactated ringers  bolus 500 mL (500 mLs Intravenous New Bag/Given 07/17/24 2130)    Medical Decision Making:   Heather Rhodes is a 89 y.o. female who presents for generalized weakness as per above.  Physical exam is pertinent for globally 4+/5.   The differential includes but is not limited to rhabdomyolysis, viral infection, dehydration, metabolic derangement, UTI, sepsis.  Independent historian: None  External data reviewed: Labs: reviewed prior labs for baseline  Initial Plan:  Screening labs including CBC and Metabolic panel to evaluate for infectious or metabolic etiology of disease.  Screening CK for rhabdomyolysis COVID/flu/RSV given high community prevalence of recent viral illness and correlation between these viruses and myalgias Urinalysis with reflex culture ordered to evaluate for UTI or relevant urologic/nephrologic pathology.  EKG to evaluate for cardiac pathology. Objective evaluation as below reviewed   Labs: Ordered, Independent interpretation, and Details: UA with evidence of UTI with bacteria, nitrites, leukocyte esterase.  CK WNL at 144.  CBC without leukocytosis, anemia, thrombocytopenia.  CMP without AKI, emergent electro gentian, emergent LFT abnormality repeat is positive for COVID  Radiology: Not indicated CT ABDOMEN PELVIS W CONTRAST Result Date: 07/05/2024 CLINICAL DATA:  Acute pancreatitis.  Abdominal pain and nausea. EXAM: CT ABDOMEN AND PELVIS WITH CONTRAST  TECHNIQUE: Multidetector CT imaging of the abdomen and pelvis was performed using the standard protocol following bolus administration of intravenous contrast. RADIATION DOSE REDUCTION: This exam was performed according to the departmental dose-optimization program which includes automated exposure control, adjustment of the mA and/or kV according to patient size and/or use of iterative reconstruction technique. CONTRAST:  80mL ISOVUE -300 IOPAMIDOL  (ISOVUE -300) INJECTION 61% COMPARISON:  None Available. FINDINGS: Lower Chest: No acute findings. Hepatobiliary: No suspicious hepatic masses identified. A few tiny sub-cm cysts are noted. Gallbladder is unremarkable. No evidence of biliary ductal dilatation. Pancreas: No mass or inflammatory changes. No evidence of pancreatic calcifications, ductal dilatation, or peripancreatic fluid collections. Spleen: Within normal limits in size and appearance. Adrenals/Urinary Tract: No suspicious masses identified. No evidence of ureteral calculi or hydronephrosis. Stomach/Bowel: Large hiatal hernia is seen which contains nearly the entire stomach and a loop of transverse colon. No evidence of obstruction, inflammatory process or abnormal fluid collections. Large colonic stool burden noted. Vascular/Lymphatic: No pathologically enlarged lymph nodes. No acute vascular findings. Reproductive: Prior  hysterectomy noted. Adnexal regions are unremarkable in appearance. Other:  None. Musculoskeletal: No suspicious bone lesions identified. Right hip prosthesis and severe left hip osteoarthritis are noted. Advanced thoracolumbar spine degenerative changes also seen. IMPRESSION: No radiographic evidence of pancreatitis or other acute findings. Large hiatal hernia containing nearly the entire stomach and a loop of transverse colon. No evidence of obstruction. Large colonic stool burden; recommend clinical correlation for possible constipation. Electronically Signed   By: Norleen DELENA Kil M.D.    On: 07/05/2024 13:19    EKG/Medicine tests: Ordered and Independent interpretation EKG Interpretation: Sinus rhythm Right axis deviation Low voltage, extremity and precordial leads Confirmed by Rogelia Satterfield ([id number]) on 07/17/2024 10:22:03 PM                Interventions: LR bolus  See the EMR for full details regarding lab and imaging results.  Patient presents for generalized weakness, does not have any asymmetry of weakness, does not have any localizing deficits on exam, does not have any other sick symptoms or correlating history aside from nonspecific decreased p.o. intake.  Patient is globally mildly weak, however given patient at baseline is independently ambulatory, do feel that patient warrants screening labs as per initial plan above.  Additionally will give gentle 500 cc LR bolus while obtaining labs.  Labs obtained, no evidence of rhabdomyolysis, no significant leukocytosis, however patient does have evidence of both UTI as well as COVID, both of which are likely contributing to patient's weakness.  Patient reevaluated after initial vomiting cc bolus, and states that she feels overall unchanged.  Patient is generally independently ambulatory at baseline, though she does have multiple available system devices at home (rollator, etc.), however patient is unable to even sit up in bed, and is unable to stand from the side of the bed despite significant help, thus do feel that patient warrants admission for continued rehydration, antibiotics.  Thus blood culture, lactic acid ordered, additional 500 cc fluid bolus ordered as well as ceftriaxone  and hospitalist consulted for admission.  Spoke with Dr. Silvester who accepted the patient to her service.  Presentation is most consistent with acute complicated illness and Current presentation is complicated by underlying chronic conditions  Discussion of management or test interpretations with external provider(s): Dr. Silvester,  hospitalists  Risk Drugs:OTC drugs and Prescription drug management Treatment: Decision regarding hospitalization  Disposition: ADMIT: I believe the patient requires admission for further care and management. The patient was admitted to hospitalists. Please see inpatient provider note for additional treatment plan details.   MDM generated using voice dictation software and may contain dictation errors.  Please contact me for any clarification or with any questions.  Clinical Impression:  1. Generalized weakness   2. Dehydration   3. COVID   4. Cystitis      Admit   Final Clinical Impression(s) / ED Diagnoses Final diagnoses:  Generalized weakness  Dehydration  COVID  Cystitis    Rx / DC Orders ED Discharge Orders     None        Rogelia Satterfield RAMAN, MD 07/17/24 2333  "

## 2024-07-17 NOTE — ED Triage Notes (Signed)
 The patient presents from [facility] due to leg weakness for about 1-2 days. She has had difficulty ambulating and has even had to call out for a lift assist.     EMS vitals: 130 palpated BP 96 HR 92% SPO2 on room air 172 CBG

## 2024-07-17 NOTE — H&P (Signed)
 "    Heather Rhodes FMW:[id number] DOB: 1929-12-14 DOA: 07/17/2024     PCP: Ozell Heron HERO, MD      Patient arrived to ER on 07/17/24 at 2036 Referred by Attending Rogelia Jerilynn RAMAN, MD   Patient coming from:  From facility     Chief Complaint:  Chief Complaint  Patient presents with   Extremity Weakness    legs    HPI: Heather Rhodes is a 89 y.o. female with medical history significant of hiatal hernia, constipation    Presented with   significant fatigue trouble with ambulation Patient comes in from assisted living [facility] with leg weakness for about 2 days have trouble ambulating needing help with sit extensive movement seems like progressive and generalized weakness Will not aware any fevers or chills no chest pain she is not short of breath have not had any nausea or vomiting Yesterday she had some myalgias but today is getting worse she was so weak that she could not stand up by herself No recent trauma no falls Has had decreased p.o. intake In ER found to have possible UTI and also tested positive for COVID She was given IV fluids And given a dose of Rocephin  for UTI Despite fluid patient remained generally weak and unable to ambulate    Patient states she went out to the cold on 2 days ago and did have a mild cough after that but no other respiratory complaints Denies dysuria  Denies significant ETOH intake   Does not smoke     Regarding pertinent Chronic problems:      last echo  Recent Results (from the past 56199 hours)  ECHOCARDIOGRAM COMPLETE   Collection Time: 09/25/20  3:52 PM  Result Value   Area-P 1/2 2.73   S' Lateral 1.60   Narrative      ECHOCARDIOGRAM REPORT       Patient Name:   Heather Rhodes Date of Exam: 09/25/2020 Medical Rec #:  [id number]       Height:       60.0 in Accession #:    [id number]      Weight:       116.0 lb Date of Birth:  August 06, 1929      BSA:          1.481 m Patient Age:    89 years        BP:            134/74 mmHg Patient Gender: F               HR:           89 bpm. Exam Location:  [address]  Procedure: 2D Echo, Cardiac Doppler and Color Doppler  Indications:    R07.89 Chest pressure   History:        Patient has no prior history of Echocardiogram examinations.   Sonographer:    Carl Coma RDCS Referring Phys: [id number] Heather Rhodes  IMPRESSIONS    1. Left ventricular ejection fraction, by estimation, is 60 to 65%. The left ventricle has normal function. The left ventricle has no regional wall motion abnormalities. Left ventricular diastolic parameters are indeterminate.  2. Right ventricular systolic function is normal. The right ventricular size is normal.  3. The mitral valve is normal in structure. Trivial mitral valve regurgitation.  4. Tricuspid valve regurgitation is mild to moderate.  5. The aortic valve is tricuspid. Aortic valve regurgitation is not visualized. Mild aortic valve sclerosis  is present, with no evidence of aortic valve stenosis.  6. The inferior vena cava is normal in size with greater than 50% respiratory variability, suggesting right atrial pressure of 3 mmHg.            Cancer: hx of uterine ca     While in ER:   Found to have urinary tract infection and COVID Despite gentle rehydration continues to be somewhat weak and symptomatic     Lab Orders         Resp panel by RT-PCR (RSV, Flu A&B, Covid) Anterior Nasal Swab         Culture, blood (single)         CBC with Differential         Comprehensive metabolic panel         CK         Urinalysis, w/ Reflex to Culture (Infection Suspected) -Urine, Clean Catch         I-Stat CG4 Lactic Acid       CXR - atelectasis, cardiomegaly  KUB - constipation Following Medications were ordered in ER: Medications  cefTRIAXone  (ROCEPHIN ) 1 g in sodium chloride  0.9 % 100 mL IVPB (has no administration in time range)  lactated ringers  bolus 500 mL (has no administration in time  range)  lactated ringers  bolus 500 mL (500 mLs Intravenous New Bag/Given 07/17/24 2130)        ED Triage Vitals  Encounter Vitals Group     BP 07/17/24 2049 120/74     Girls Systolic BP Percentile --      Girls Diastolic BP Percentile --      Boys Systolic BP Percentile --      Boys Diastolic BP Percentile --      Pulse Rate 07/17/24 2049 90     Resp 07/17/24 2049 17     Temp 07/17/24 2049 99.3 F (37.4 C)     Temp Source 07/17/24 2049 Oral     SpO2 07/17/24 2049 97 %     Weight 07/17/24 2048 112 lb (50.8 kg)     Height 07/17/24 2048 5' (1.524 m)     Head Circumference --      Peak Flow --      Pain Score 07/17/24 2048 0     Pain Loc --      Pain Education --      Exclude from Growth Chart --   UFJK(75)@     _________________________________________ Significant initial  Findings: Abnormal Labs Reviewed  RESP PANEL BY RT-PCR (RSV, FLU A&B, COVID)  RVPGX2 - Abnormal; Notable for the following components:      Result Value   SARS Coronavirus 2 by RT PCR POSITIVE (*)    All other components within normal limits  CBC WITH DIFFERENTIAL/PLATELET - Abnormal; Notable for the following components:   RBC 3.82 (*)    Lymphs Abs 0.5 (*)    Monocytes Absolute 1.1 (*)    All other components within normal limits  COMPREHENSIVE METABOLIC PANEL WITH GFR - Abnormal; Notable for the following components:   Glucose, Bld 110 (*)    BUN 24 (*)    GFR, Estimated 59 (*)    All other components within normal limits  URINALYSIS, W/ REFLEX TO CULTURE (INFECTION SUSPECTED) - Abnormal; Notable for the following components:   APPearance HAZY (*)    Protein, ur 30 (*)    Nitrite POSITIVE (*)    Leukocytes,Ua TRACE (*)    Bacteria, UA  MANY (*)    All other components within normal limits   _______________   Cardiac Panel (last 3 results) Recent Labs    07/17/24 2125  CKTOTAL 144     ECG: Ordered Personally reviewed and interpreted by me showing: HR : 90 Rhythm:Sinus rhythm Right axis  deviation Low voltage, extremity and precordial lead QTC 435  ____________________   The recent clinical data is shown below. Vitals:   07/17/24 2048 07/17/24 2049  BP:  120/74  Pulse:  90  Resp:  17  Temp:  99.3 F (37.4 C)  TempSrc:  Oral  SpO2:  97%  Weight: 50.8 kg   Height: 5' (1.524 m)     WBC     Component Value Date/Time   WBC 6.6 07/17/2024 2125   LYMPHSABS 0.5 (L) 07/17/2024 2125   MONOABS 1.1 (H) 07/17/2024 2125   EOSABS 0.0 07/17/2024 2125   BASOSABS 0.1 07/17/2024 2125    Procalcitonin   Ordered      UA  evidence of UTI     Urine analysis:    Component Value Date/Time   COLORURINE YELLOW 07/17/2024 2222   APPEARANCEUR HAZY (A) 07/17/2024 2222   LABSPEC 1.020 07/17/2024 2222   PHURINE 5.0 07/17/2024 2222   GLUCOSEU NEGATIVE 07/17/2024 2222   HGBUR NEGATIVE 07/17/2024 2222   BILIRUBINUR NEGATIVE 07/17/2024 2222   BILIRUBINUR neg 02/13/2024 1514   KETONESUR NEGATIVE 07/17/2024 2222   PROTEINUR 30 (A) 07/17/2024 2222   UROBILINOGEN 0.2 02/13/2024 1514   NITRITE POSITIVE (A) 07/17/2024 2222   LEUKOCYTESUR TRACE (A) 07/17/2024 2222    Results for orders placed or performed during the hospital encounter of 07/17/24  Resp panel by RT-PCR (RSV, Flu A&B, Covid) Anterior Nasal Swab     Status: Abnormal   Collection Time: 07/17/24  9:26 PM   Specimen: Anterior Nasal Swab  Result Value Ref Range Status   SARS Coronavirus 2 by RT PCR POSITIVE (A) NEGATIVE Final         Influenza A by PCR NEGATIVE NEGATIVE Final   Influenza B by PCR NEGATIVE NEGATIVE Final         Resp Syncytial Virus by PCR NEGATIVE NEGATIVE Final          ABX started Antibiotics Given (last 72 hours)     Date/Time Action Medication Dose Rate   07/17/24 2359 New Bag/Given   cefTRIAXone  (ROCEPHIN ) 1 g in sodium chloride  0.9 % 100 mL IVPB 1 g 200 mL/hr       __________________________________________________________ Recent Labs  Lab 07/17/24 2125  NA 137  K 4.3  CO2 23   GLUCOSE 110*  BUN 24*  CREATININE 0.91  CALCIUM 9.1    Cr    stable,   Lab Results  Component Value Date   CREATININE 0.91 07/17/2024   CREATININE 0.79 06/15/2024   CREATININE 0.95 12/31/2023    Recent Labs  Lab 07/17/24 2125  AST 28  ALT 19  ALKPHOS 85  BILITOT <0.2  PROT 6.7  ALBUMIN 3.9   Lab Results  Component Value Date   CALCIUM 9.1 07/17/2024    Plt: Lab Results  Component Value Date   PLT 233 07/17/2024         Recent Labs  Lab 07/17/24 2125  WBC 6.6  NEUTROABS 4.8  HGB 12.3  HCT 36.9  MCV 96.6  PLT 233    HG/HCT  stable,      Component Value Date/Time   HGB 12.3 07/17/2024 2125  HCT 36.9 07/17/2024 2125   MCV 96.6 07/17/2024 2125    _______________________________________________ Hospitalist was called for admission for   Generalized weakness  Dehydration  COVID UTI    The following Work up has been ordered so far:  Orders Placed This Encounter  Procedures   Resp panel by RT-PCR (RSV, Flu A&B, Covid) Anterior Nasal Swab   Culture, blood (single)   CBC with Differential   Comprehensive metabolic panel   CK   Urinalysis, w/ Reflex to Culture (Infection Suspected) -Urine, Clean Catch   Ambulate patient   Check temperature   Consult to hospitalist   I-Stat CG4 Lactic Acid   ED EKG   EKG 12-Lead     OTHER Significant initial  Findings:  labs showing:     DM  labs:  HbA1C: Recent Labs    12/31/23 1148  HGBA1C 5.9       CBG (last 3)  No results for input(s): GLUCAP in the last 72 hours.        Cultures: No results found for: SDES, SPECREQUEST, CULT, REPTSTATUS   Radiological Exams on Admission: No results found. _______________________________________________________________________________________________________ Latest  Blood pressure 120/74, pulse 90, temperature 99.3 F (37.4 C), temperature source Oral, resp. rate 17, height 5' (1.524 m), weight 50.8 kg, SpO2 97%.   Vitals  labs and radiology  finding personally reviewed  Review of Systems:    Pertinent positives include:    Constitutional:  No weight loss, night sweats, Fevers, chills, fatigue, weight loss  HEENT:  No headaches, Difficulty swallowing,Tooth/dental problems,Sore throat,  No sneezing, itching, ear ache, nasal congestion, post nasal drip,  Cardio-vascular:  No chest pain, Orthopnea, PND, anasarca, dizziness, palpitations.no Bilateral lower extremity swelling  GI:  No heartburn, indigestion, abdominal pain, nausea, vomiting, diarrhea, change in bowel habits, loss of appetite, melena, blood in stool, hematemesis Resp:  no shortness of breath at rest. No dyspnea on exertion, No excess mucus, no productive cough, No non-productive cough, No coughing up of blood.No change in color of mucus.No wheezing. Skin:  no rash or lesions. No jaundice GU:  no dysuria, change in color of urine, no urgency or frequency. No straining to urinate.  No flank pain.  Musculoskeletal:  No joint pain or no joint swelling. No decreased range of motion. No back pain.  Psych:  No change in mood or affect. No depression or anxiety. No memory loss.  Neuro: no localizing neurological complaints, no tingling, no weakness, no double vision, no gait abnormality, no slurred speech, no confusion  All systems reviewed and apart from HOPI all are negative _______________________________________________________________________________________________ Past Medical History:   Past Medical History:  Diagnosis Date   Arthritis    Breast cancer (HCC)    2004-left breast per patient   Cancer [facility])    breast left and endometrial cancer   Complication of anesthesia    during hysterectomy 2012 had to wake up took 3 hours and BP was low   Personal history of chemotherapy    with endometrial cancer   Pneumonia    Squamous cell carcinoma of skin    right hand scc per patient   Urinary incontinence    UTI (urinary tract infection)       Past  Surgical History:  Procedure Laterality Date   ABDOMINAL HYSTERECTOMY     CARPAL TUNNEL RELEASE     Masectomy Left 2004   MASTECTOMY     left  2006   TONSILLECTOMY     1942   TOTAL  HIP ARTHROPLASTY Right 12/09/2018   Procedure: RIGHT TOTAL HIP ARTHROPLASTY ANTERIOR APPROACH;  Surgeon: Melodi Lerner, MD;  Location: [facility];  Service: Orthopedics;  Laterality: Right;   TRIGGER FINGER RELEASE      Social History:  Ambulatory   cane, walker     reports that she has quit smoking. Her smoking use included cigarettes. She has never used smokeless tobacco. She reports current alcohol use. She reports that she does not use drugs.   Family History:   Family History  Problem Relation Age of Onset   Heart attack Mother    Cancer Son    ______________________________________________________________________________________________ Allergies: Allergies[1]   Prior to Admission medications  Medication Sig Start Date End Date Taking? Authorizing Provider  ascorbic acid (VITAMIN C) 500 MG tablet Take 500 mg by mouth daily.   Yes [provider]  diclofenac  (VOLTAREN ) 75 MG EC tablet TAKE 1 TABLET BY MOUTH 2 TIMES DAILY AS NEEDED. 05/05/24  Yes Ozell Heron HERO, MD  famotidine  (PEPCID ) 20 MG tablet Take 1 tablet (20 mg total) by mouth 2 (two) times daily. Patient taking differently: Take 20 mg by mouth 2 (two) times daily as needed. 06/15/24  Yes Ozell Heron HERO, MD  Multiple Vitamin (MULTIVITAMIN PO) Take 1 tablet by mouth daily.   Yes [provider]  Multiple Vitamins-Minerals (ZINC PO) Take 1 tablet by mouth daily. 120mg  calcium daily/15mg  zinc daily   Yes [provider]  OVER THE COUNTER MEDICATION Take 1 tablet by mouth daily. Magnesium 200mg -potassium 99mcg daily   Yes [provider]  Polyethyl Glycol-Propyl Glycol (SYSTANE OP) Place 1 drop into both eyes 3 (three) times daily.   Yes [provider]  VITAMIN D  PO Take 50 mcg by mouth  daily.   Yes [provider]  ondansetron  (ZOFRAN -ODT) 4 MG disintegrating tablet Take 1 tablet (4 mg total) by mouth every 8 (eight) hours as needed for nausea or vomiting. Patient not taking: Reported on 07/17/2024 06/15/24   Ozell Heron HERO, MD    ___________________________________________________________________________________________________ Physical Exam:    07/17/2024    8:49 PM 07/17/2024    8:48 PM 07/15/2024   12:56 PM  Vitals with BMI  Height  5' 0 5' 0  Weight  112 lbs 110 lbs 3 oz  BMI  21.87 21.52  Systolic 120  132  Diastolic 74  70  Pulse 90  87     1. General:  in No  Acute distress  * Chronically ill   -appearing 2. Psychological: Alert and   Oriented 3. Head/ENT:  Dry Mucous Membranes                          Head Non traumatic, neck supple                          Poor Dentition 4. SKIN: normal  Skin turgor,  Skin clean Dry and intact no rash    5. Heart: Regular rate and rhythm no  Murmur, no Rub or gallop 6. Lungs:   no wheezes or crackles   7. Abdomen: Soft,  non-tender, Non distended  bowel sounds present 8. Lower extremities: no clubbing, cyanosis, no  edema 9. Neurologically Grossly intact, moving all 4 extremities equally   10. MSK: Normal range of motion    Chart has been reviewed  ______________________________________________________________________________________________  Assessment/Plan  89 y.o. female with medical history significant of hiatal hernia,  constipation  Admitted for   Generalized weakness  Dehydration  COVID UTI    Present on Admission:  COVID-19 virus infection  UTI (urinary tract infection)  Debility  Constipation     UTI (urinary tract infection)  - treat with Rocephin    await results of urine culture and adjust antibiotic coverage as needed   Debility Will need PT OT assessment prior to discharge  COVID-19 virus infection Likely contributing to generalized fatigue Denies any cough no fevers  no chills no respiratory complaints No oxygen requirement Lobe incidental finding will continue to monitor supportive management   Constipation Will order bowel regimen   Other plan as per orders.  DVT prophylaxis:  HEp [place]    Code Status:    Code Status: Prior FULL CODE as per patient    I had personally discussed CODE STATUS with patient  ACP   none   Family Communication:   Family not at  Bedside    Diet  heart healthy   Disposition Plan: Back to current facility when stable                             Following barriers for discharge:                             Fatigue improves    Consult Orders  (From admission, onward)           Start     Ordered   07/17/24 2304  Consult to hospitalist  Once       Provider:  (Not yet assigned)  Question Answer Comment  Place call to: [facility]   Reason for Consult Admit      07/17/24 2303                               Would benefit from PT/OT eval prior to DC  Ordered                      Consults called:    NONE   Admission status:  ED Disposition     ED Disposition  Admit   Condition  --   Comment  Hospital Area: [facility] [[id number]]  Level of Care: Telemetry [5]  Admit to tele based on following criteria: Other see comments  Comments: covid  May place patient in observation at [facility] or [facility] if equivalent level of care is available:: No  Diagnosis: COVID-19 virus infection [[id number]]  Admitting Physician: Rupert Azzara [[id number]]  Attending Physician: Adalina Dopson [[id number]]          Obs    Level of care     tele  For  24H     Sirenity Shew 07/18/2024, 12:30 AM    [facility]     after 2 AM please page floor coverage   If 7AM-7PM, please contact the day team taking care of the patient using [email or web address]        [1]  Allergies Allergen Reactions   Naproxen Sodium     wheezing   "

## 2024-07-17 NOTE — Subjective & Objective (Signed)
 Patient comes in from assisted living [facility] with leg weakness for about 2 days have trouble ambulating needing help with sit extensive movement seems like progressive and generalized weakness Will not aware any fevers or chills no chest pain she is not short of breath have not had any nausea or vomiting Yesterday she had some myalgias but today is getting worse she was so weak that she could not stand up by herself No recent trauma no falls Has had decreased p.o. intake In ER found to have possible UTI and also tested positive for COVID She was given IV fluids And given a dose of Rocephin  for UTI Despite fluid patient remained generally weak and unable to ambulate

## 2024-07-18 DIAGNOSIS — R5381 Other malaise: Secondary | ICD-10-CM | POA: Diagnosis not present

## 2024-07-18 DIAGNOSIS — N3000 Acute cystitis without hematuria: Secondary | ICD-10-CM | POA: Diagnosis not present

## 2024-07-18 DIAGNOSIS — K5900 Constipation, unspecified: Secondary | ICD-10-CM | POA: Diagnosis present

## 2024-07-18 DIAGNOSIS — U071 COVID-19: Secondary | ICD-10-CM | POA: Diagnosis not present

## 2024-07-18 LAB — BLOOD CULTURE ID PANEL (REFLEXED) - BCID2

## 2024-07-18 LAB — I-STAT CG4 LACTIC ACID, ED: Lactic Acid, Venous: 1.4 mmol/L (ref 0.5–1.9)

## 2024-07-18 LAB — PHOSPHORUS: Phosphorus: 4.2 mg/dL (ref 2.5–4.6)

## 2024-07-18 LAB — PROCALCITONIN: Procalcitonin: <0.1 ng/mL

## 2024-07-18 LAB — MAGNESIUM: Magnesium: 2.2 mg/dL (ref 1.7–2.4)

## 2024-07-18 MED ORDER — DOCUSATE SODIUM 100 MG PO CAPS
100.0000 mg | ORAL_CAPSULE | Freq: Two times a day (BID) | ORAL | Status: DC
  Administered 2024-07-18: 100 mg via ORAL

## 2024-07-18 MED ORDER — CEPHALEXIN 250 MG PO CAPS: 250.0000 mg | ORAL_CAPSULE | Freq: Three times a day (TID) | ORAL | 0 refills | Status: DC

## 2024-07-18 MED ORDER — SODIUM CHLORIDE 0.9 % IV SOLN: INTRAVENOUS | Status: DC

## 2024-07-18 MED ORDER — HYDROCODONE-ACETAMINOPHEN 5-325 MG PO TABS: 1.0000 | ORAL_TABLET | ORAL | Status: DC | PRN

## 2024-07-18 MED ORDER — ACETAMINOPHEN 325 MG PO TABS
650.0000 mg | ORAL_TABLET | Freq: Four times a day (QID) | ORAL | Status: DC | PRN
  Filled 2024-07-18: qty 2

## 2024-07-18 MED ORDER — HEPARIN SODIUM (PORCINE) 5000 UNIT/ML IJ SOLN
5000.0000 [IU] | Freq: Three times a day (TID) | INTRAMUSCULAR | Status: DC
  Administered 2024-07-18: 5000 [IU] via SUBCUTANEOUS
  Filled 2024-07-18: qty 1

## 2024-07-18 MED ORDER — GUAIFENESIN ER 600 MG PO TB12
600.0000 mg | ORAL_TABLET | Freq: Two times a day (BID) | ORAL | Status: DC
  Administered 2024-07-18: 600 mg via ORAL
  Filled 2024-07-18: qty 1

## 2024-07-18 MED ORDER — ACETAMINOPHEN 650 MG RE SUPP: 650.0000 mg | Freq: Four times a day (QID) | RECTAL | Status: DC | PRN

## 2024-07-18 MED ORDER — ONDANSETRON HCL 4 MG/2ML IJ SOLN: 4.0000 mg | Freq: Four times a day (QID) | INTRAMUSCULAR | Status: DC | PRN

## 2024-07-18 MED ORDER — ALBUTEROL SULFATE (2.5 MG/3ML) 0.083% IN NEBU: 2.5000 mg | INHALATION_SOLUTION | RESPIRATORY_TRACT | Status: DC | PRN

## 2024-07-18 MED ORDER — ONDANSETRON HCL 4 MG PO TABS: 4.0000 mg | ORAL_TABLET | Freq: Four times a day (QID) | ORAL | Status: DC | PRN

## 2024-07-18 MED ORDER — POLYETHYLENE GLYCOL 3350 17 G PO PACK
17.0000 g | PACK | Freq: Every day | ORAL | Status: DC
  Administered 2024-07-18: 17 g via ORAL
  Filled 2024-07-18: qty 1

## 2024-07-18 NOTE — Evaluation (Signed)
 Occupational Therapy Evaluation Patient Details Name: Heather Rhodes MRN: [id number] DOB: May 04, 1930 Today's Date: 07/18/2024   History of Present Illness   Heather Rhodes is a 89 yr old female brought to the hospital with fatigue, weakness, and difficulty ambulating. She was found to have dehydration and COVID-19. PMH: hiatal hernia, constipation, arthritis, L breast CA     Clinical Impressions The pt is currently presenting slightly below her baseline level of functioning for self care management. She is limited by the below listed deficits (see OT problem list). During the session today, she required CGA to min assist for supine to sit, sit to stand using a RW, for toileting management at bedside commode level, and for functional ambulation into the hall using a RW. She will benefit from further OT services to maximize her independence with ADLs and to decrease the risk for restricted participation in meaningful activities. Home health OT is recommended.     If plan is discharge home, recommend the following:   A little help with walking and/or transfers;A little help with bathing/dressing/bathroom;Assistance with cooking/housework;Assist for transportation     Functional Status Assessment   Patient has had a recent decline in their functional status and demonstrates the ability to make significant improvements in function in a reasonable and predictable amount of time.     Equipment Recommendations   None recommended by OT     Recommendations for Other Services         Precautions/Restrictions   Restrictions Weight Bearing Restrictions Per Provider Order: No     Mobility Bed Mobility Overal bed mobility: Needs Assistance Bed Mobility: Supine to Sit     Supine to sit: Contact guard, HOB elevated, Used rails          Transfers Overall transfer level: Needs assistance Equipment used: Rolling walker (2 wheels) Transfers: Sit to/from Stand, Bed to  chair/wheelchair/BSC Sit to Stand: Contact guard assist     Step pivot transfers: Min assist (to bedside commode for toileting management)            Balance       Sitting balance - Comments: static sitting-good. dynamic sitting-fair+       Standing balance comment: CGA to min assist with RW         ADL either performed or assessed with clinical judgement   ADL Overall ADL's : Needs assistance/impaired Eating/Feeding: Independent;Sitting   Grooming: Set up;Sitting   Upper Body Bathing: Set up;Sitting   Lower Body Bathing: Minimal assistance;Sitting/lateral leans   Upper Body Dressing : Set up;Supervision/safety;Sitting   Lower Body Dressing: Minimal assistance;Moderate assistance;Sitting/lateral leans   Toilet Transfer: Minimal assistance;BSC/3in1;Rolling walker (2 wheels);Stand-pivot   Toileting- Clothing Manipulation and Hygiene: Minimal assistance;Sit to/from stand Toileting - Clothing Manipulation Details (indicate cue type and reason): toileting management was performed with the pt at bedside commode level             Vision   Additional Comments: She correctly read the time depicted on the wall clock.            Pertinent Vitals/Pain Pain Assessment Pain Assessment: No/denies pain     Extremity/Trunk Assessment Upper Extremity Assessment Upper Extremity Assessment: Overall WFL for tasks assessed;Right hand dominant   Lower Extremity Assessment Lower Extremity Assessment: RLE deficits/detail;LLE deficits/detail RLE Deficits / Details: AROM WFL LLE Deficits / Details: AROM WFL       Communication Communication Communication: No apparent difficulties   Cognition Arousal: Alert Behavior During Therapy: WFL for tasks assessed/performed  Cognition: No apparent impairments             OT - Cognition Comments: Oriented x4                 Following commands: Intact       Cueing  General Comments   Cueing Techniques: Verbal  cues              Home Living Family/patient expects to be discharged to:: Other (Comment) (Independent living facility) Living Arrangements: Alone   Type of Home: Independent living facility ([facility]) Home Access: Level entry     Home Layout: One level     Bathroom Shower/Tub: Walk-in shower             Additional Comments: 3 wheeled walker      Prior Functioning/Environment               Mobility Comments: She holds onto furniture inside the home and uses a cane or RW when outside the home. ADLs Comments: Modified independent to independent with ADLs, cooking, and cleaning. She also drives. Her family does her laundry.    OT Problem List: Decreased strength;Impaired balance (sitting and/or standing);Decreased knowledge of use of DME or AE   OT Treatment/Interventions: Self-care/ADL training;Therapeutic exercise;Energy conservation;DME and/or AE instruction;Therapeutic activities;Balance training;Patient/family education      OT Goals(Current goals can be found in the care plan section)   Acute Rehab OT Goals Patient Stated Goal: the pt desires to return home soon OT Goal Formulation: With patient/family Time For Goal Achievement: 08/01/24 Potential to Achieve Goals: Good ADL Goals Pt Will Perform Grooming: with set-up;with supervision;standing Pt Will Perform Lower Body Dressing: with supervision;with set-up;sitting/lateral leans;sit to/from stand Pt Will Transfer to Toilet: with supervision;ambulating Pt Will Perform Toileting - Clothing Manipulation and hygiene: with supervision;sit to/from stand Pt/caregiver will Perform Home Exercise Program: Increased strength;Both right and left upper extremity;With theraband;With Supervision   OT Frequency:  Min 2X/week    Co-evaluation PT/OT/SLP Co-Evaluation/Treatment: Yes Reason for Co-Treatment: For patient/therapist safety;To address functional/ADL transfers PT goals addressed during session:  Mobility/safety with mobility OT goals addressed during session: ADL's and self-care;Proper use of Adaptive equipment and DME      AM-PAC OT 6 Clicks Daily Activity     Outcome Measure Help from another person eating meals?: None Help from another person taking care of personal grooming?: A Little Help from another person toileting, which includes using toliet, bedpan, or urinal?: A Little Help from another person bathing (including washing, rinsing, drying)?: A Little Help from another person to put on and taking off regular upper body clothing?: A Little Help from another person to put on and taking off regular lower body clothing?: A Little 6 Click Score: 19   End of Session Equipment Utilized During Treatment: Rolling walker (2 wheels);Gait belt Nurse Communication: Mobility status  Activity Tolerance: Patient tolerated treatment well Patient left: in bed;with call bell/phone within reach;with family/visitor present  OT Visit Diagnosis: Muscle weakness (generalized) (M62.81)                Time: 8894-8861 OT Time Calculation (min): 33 min Charges:  OT General Charges $OT Visit: 1 Visit OT Evaluation $OT Eval Moderate Complexity: 1 Mod   Landrey Mahurin J Harris, OTR/L 07/18/2024, 1:22 PM

## 2024-07-18 NOTE — Discharge Summary (Signed)
 " Physician Discharge Summary   Patient: Heather Rhodes MRN: [id number] DOB: 06/15/1930  Admit date:     07/17/2024  Discharge date: {dischdate:26783}  Discharge Physician: Concepcion Riser   PCP: Ozell Heron HERO, MD   Recommendations at discharge:  {Tip this will not be part of the note when signed- Example include specific recommendations for outpatient follow-up, pending tests to follow-up on. (Optional):26781}  ***  Discharge Diagnoses: Principal Problem:   COVID-19 virus infection Active Problems:   UTI (urinary tract infection)   Debility   Constipation  Resolved Problems:   * No resolved hospital problems. [facility] Course: No notes on file  Assessment and Plan: No notes have been filed under this hospital service. Service: Hospitalist     {Tip this will not be part of the note when signed Body mass index is 21.87 kg/m. , ,  (Optional):26781}  {(NOTE) Pain control PDMP Statment (Optional):26782} Consultants: *** Procedures performed: ***  Disposition: {Plan; Disposition:26390} Diet recommendation:  Discharge Diet Orders (From admission, onward)     Start     Ordered   07/18/24 0000  Diet general        07/18/24 1215           {Diet_Plan:26776} DISCHARGE MEDICATION: Allergies as of 07/18/2024       Reactions   Naproxen Sodium    wheezing        Medication List     STOP taking these medications    ondansetron  4 MG disintegrating tablet Commonly known as: ZOFRAN -ODT       TAKE these medications    ascorbic acid 500 MG tablet Commonly known as: VITAMIN C Take 500 mg by mouth daily.   cephALEXin  250 MG capsule Commonly known as: KEFLEX  Take 1 capsule (250 mg total) by mouth 3 (three) times daily for 5 days.   diclofenac  75 MG EC tablet Commonly known as: VOLTAREN  TAKE 1 TABLET BY MOUTH 2 TIMES DAILY AS NEEDED.   famotidine  20 MG tablet Commonly known as: PEPCID  Take 1 tablet (20 mg total) by mouth 2 (two) times  daily. What changed:  when to take this reasons to take this   MULTIVITAMIN PO Take 1 tablet by mouth daily.   OVER THE COUNTER MEDICATION Take 1 tablet by mouth daily. Magnesium 200mg -potassium 99mcg daily   SYSTANE OP Place 1 drop into both eyes 3 (three) times daily.   VITAMIN D  PO Take 50 mcg by mouth daily.   ZINC PO Take 1 tablet by mouth daily. 120mg  calcium daily/15mg  zinc daily        Discharge Exam: Filed Weights   07/17/24 2048  Weight: 50.8 kg   ***  Condition at discharge: {DC Condition:26389}  The results of significant diagnostics from this hospitalization (including imaging, microbiology, ancillary and laboratory) are listed below for reference.   Imaging Studies: DG CHEST PORT 1 VIEW Result Date: 07/17/2024 EXAM: 1 VIEW(S) XRAY OF THE CHEST 07/17/2024 11:54:19 PM COMPARISON: 09/30/2023 CLINICAL HISTORY: COVID-19 virus infection; Constipation FINDINGS: LUNGS AND PLEURA: Left basilar atelectasis is noted. No pleural effusion. No pneumothorax. HEART AND MEDIASTINUM: Heart remains enlarged. Atherosclerotic plaque. Known hiatal hernia is seen. BONES AND SOFT TISSUES: No acute osseous abnormality. IMPRESSION: 1. Left basilar atelectasis. 2. Cardiomegaly. Electronically signed by: Oneil Devonshire MD 07/17/2024 11:59 PM EST RP Workstation: [id number]   DG Abd 1 View Result Date: 07/17/2024 EXAM: 1 VIEW XRAY OF THE ABDOMEN 07/17/2024 11:54:19 PM COMPARISON: None available. CLINICAL HISTORY: COVID-19 virus infection [id number]; [id number]  Constipation [id number]. Abdominal pain and possible constipation. FINDINGS: BOWEL: Moderate colonic stool burden. Nonobstructive bowel gas pattern. SOFT TISSUES: No abnormal calcifications. BONES: Right hip arthroplasty noted. Dextroscoliosis. Severe degenerative changes of the left hip with bone-on-bone contact. No acute fracture. CHEST FINDINGS: Cardiomegaly. Left diaphragmatic hernia. IMPRESSION: 1. Moderate colonic stool burden without  obstructive changes. 2. Left diaphragmatic hernia. Electronically signed by: Oneil Devonshire MD 07/17/2024 11:57 PM EST RP Workstation: [id number]   CT ABDOMEN PELVIS W CONTRAST Result Date: 07/05/2024 CLINICAL DATA:  Acute pancreatitis.  Abdominal pain and nausea. EXAM: CT ABDOMEN AND PELVIS WITH CONTRAST TECHNIQUE: Multidetector CT imaging of the abdomen and pelvis was performed using the standard protocol following bolus administration of intravenous contrast. RADIATION DOSE REDUCTION: This exam was performed according to the departmental dose-optimization program which includes automated exposure control, adjustment of the mA and/or kV according to patient size and/or use of iterative reconstruction technique. CONTRAST:  80mL ISOVUE -300 IOPAMIDOL  (ISOVUE -300) INJECTION 61% COMPARISON:  None Available. FINDINGS: Lower Chest: No acute findings. Hepatobiliary: No suspicious hepatic masses identified. A few tiny sub-cm cysts are noted. Gallbladder is unremarkable. No evidence of biliary ductal dilatation. Pancreas: No mass or inflammatory changes. No evidence of pancreatic calcifications, ductal dilatation, or peripancreatic fluid collections. Spleen: Within normal limits in size and appearance. Adrenals/Urinary Tract: No suspicious masses identified. No evidence of ureteral calculi or hydronephrosis. Stomach/Bowel: Large hiatal hernia is seen which contains nearly the entire stomach and a loop of transverse colon. No evidence of obstruction, inflammatory process or abnormal fluid collections. Large colonic stool burden noted. Vascular/Lymphatic: No pathologically enlarged lymph nodes. No acute vascular findings. Reproductive: Prior hysterectomy noted. Adnexal regions are unremarkable in appearance. Other:  None. Musculoskeletal: No suspicious bone lesions identified. Right hip prosthesis and severe left hip osteoarthritis are noted. Advanced thoracolumbar spine degenerative changes also seen. IMPRESSION: No  radiographic evidence of pancreatitis or other acute findings. Large hiatal hernia containing nearly the entire stomach and a loop of transverse colon. No evidence of obstruction. Large colonic stool burden; recommend clinical correlation for possible constipation. Electronically Signed   By: Norleen DELENA Kil M.D.   On: 07/05/2024 13:19    Microbiology: Results for orders placed or performed during the hospital encounter of 07/17/24  Resp panel by RT-PCR (RSV, Flu A&B, Covid) Anterior Nasal Swab     Status: Abnormal   Collection Time: 07/17/24  9:26 PM   Specimen: Anterior Nasal Swab  Result Value Ref Range Status   SARS Coronavirus 2 by RT PCR POSITIVE (A) NEGATIVE Final    Comment: (NOTE) SARS-CoV-2 target nucleic acids are DETECTED.  The SARS-CoV-2 RNA is generally detectable in upper respiratory specimens during the acute phase of infection. Positive results are indicative of the presence of the identified virus, but do not rule out bacterial infection or co-infection with other pathogens not detected by the test. Clinical correlation with patient history and other diagnostic information is necessary to determine patient infection status. The expected result is Negative.  Fact Sheet for Patients: bloggercourse.com  Fact Sheet for Healthcare Providers: seriousbroker.it  This test is not yet approved or cleared by the [place]  FDA and  has been authorized for detection and/or diagnosis of SARS-CoV-2 by FDA under an Emergency Use Authorization (EUA).  This EUA will remain in effect (meaning this test can be used) for the duration of  the COVID-19 declaration under Section 564(b)(1) of the A ct, 21 U.S.C. section 360bbb-3(b)(1), unless the authorization is terminated or revoked sooner.     Influenza  A by PCR NEGATIVE NEGATIVE Final   Influenza B by PCR NEGATIVE NEGATIVE Final    Comment: (NOTE) The Xpert Xpress  SARS-CoV-2/FLU/RSV plus assay is intended as an aid in the diagnosis of influenza from Nasopharyngeal swab specimens and should not be used as a sole basis for treatment. Nasal washings and aspirates are unacceptable for Xpert Xpress SARS-CoV-2/FLU/RSV testing.  Fact Sheet for Patients: bloggercourse.com  Fact Sheet for Healthcare Providers: seriousbroker.it  This test is not yet approved or cleared by the [place]  FDA and has been authorized for detection and/or diagnosis of SARS-CoV-2 by FDA under an Emergency Use Authorization (EUA). This EUA will remain in effect (meaning this test can be used) for the duration of the COVID-19 declaration under Section 564(b)(1) of the Act, 21 U.S.C. section 360bbb-3(b)(1), unless the authorization is terminated or revoked.     Resp Syncytial Virus by PCR NEGATIVE NEGATIVE Final    Comment: (NOTE) Fact Sheet for Patients: bloggercourse.com  Fact Sheet for Healthcare Providers: seriousbroker.it  This test is not yet approved or cleared by the [place]  FDA and has been authorized for detection and/or diagnosis of SARS-CoV-2 by FDA under an Emergency Use Authorization (EUA). This EUA will remain in effect (meaning this test can be used) for the duration of the COVID-19 declaration under Section 564(b)(1) of the Act, 21 U.S.C. section 360bbb-3(b)(1), unless the authorization is terminated or revoked.  Performed at [facility], [address] [place], [place] [address]   Culture, blood (single)     Status: None (Preliminary result)   Collection Time: 07/17/24 11:55 PM   Specimen: BLOOD RIGHT ARM  Result Value Ref Range Status   Specimen Description   Final    BLOOD RIGHT ARM Performed at [facility], [address], [place], [place] [address]    Special Requests   Final    BOTTLES DRAWN AEROBIC AND  ANAEROBIC Blood Culture results may not be optimal due to an inadequate volume of blood received in culture bottles Performed at [facility], [address], [place], [place] [address]    Culture   Final    NO GROWTH <12 HOURS Performed at [facility], [address], [place], [place] [address]    Report Status PENDING  Incomplete    Labs: CBC: Recent Labs  Lab 07/17/24 2125  WBC 6.6  NEUTROABS 4.8  HGB 12.3  HCT 36.9  MCV 96.6  PLT 233   Basic Metabolic Panel: Recent Labs  Lab 07/17/24 2125  NA 137  K 4.3  CL 104  CO2 23  GLUCOSE 110*  BUN 24*  CREATININE 0.91  CALCIUM 9.1  MG 2.2  PHOS 4.2   Liver Function Tests: Recent Labs  Lab 07/17/24 2125  AST 28  ALT 19  ALKPHOS 85  BILITOT <0.2  PROT 6.7  ALBUMIN 3.9   CBG: No results for input(s): GLUCAP in the last 168 hours.  Discharge time spent: {LESS THAN/GREATER UYJW:73611} 30 minutes.  Signed: Concepcion Riser, MD [facility] 07/18/2024 "

## 2024-07-18 NOTE — Assessment & Plan Note (Signed)
Will order bowel regimen

## 2024-07-18 NOTE — Evaluation (Signed)
 Physical Therapy Evaluation Patient Details Name: Heather Rhodes MRN: [id number] DOB: 06-Jun-1930 Today's Date: 07/18/2024  History of Present Illness  Heather Rhodes is a 89 yr old female brought to the hospital with fatigue, weakness, and difficulty ambulating. She was found to have dehydration and COVID-19. PMH: hiatal hernia, constipation, arthritis, L breast CA  Clinical Impression  Pt admitted as above and presenting with functional mobility limitations 2* generalized weakness, balance deficits and decreased activity tolerance.  This date, pt up to ambulate 150' with RW and CGA for safety.  Pt reports near baseline for mobility but generally uses furniture to stabilize at home.  Pt would greatly benefit from follow up HHPT to maximize IND and safety at home.  Pt currently resides in IND living facility.      If plan is discharge home, recommend the following: A little help with walking and/or transfers;A little help with bathing/dressing/bathroom;Assistance with cooking/housework;Assist for transportation;Help with stairs or ramp for entrance   Can travel by private vehicle        Equipment Recommendations None recommended by PT  Recommendations for Other Services       Functional Status Assessment Patient has had a recent decline in their functional status and demonstrates the ability to make significant improvements in function in a reasonable and predictable amount of time.     Precautions / Restrictions Precautions Precautions: Fall Restrictions Weight Bearing Restrictions Per Provider Order: No      Mobility  Bed Mobility Overal bed mobility: Needs Assistance Bed Mobility: Supine to Sit     Supine to sit: Contact guard, HOB elevated, Used rails          Transfers Overall transfer level: Needs assistance Equipment used: Rolling walker (2 wheels) Transfers: Sit to/from Stand, Bed to chair/wheelchair/BSC Sit to Stand: Contact guard assist   Step pivot  transfers: Min assist       General transfer comment: cues for use of UEs to self assist    Ambulation/Gait Ambulation/Gait assistance: Contact guard assist Gait Distance (Feet): 150 Feet Assistive device: Rolling walker (2 wheels) Gait Pattern/deviations: Step-through pattern, Decreased step length - right, Decreased step length - left, Shuffle, Trunk flexed Gait velocity: dec     General Gait Details: CGA for safety with cues for posture and position from [employer]            Wheelchair Mobility     Tilt Bed    Modified Rankin (Stroke Patients Only)       Balance Overall balance assessment: Needs assistance Sitting-balance support: No upper extremity supported, Feet supported Sitting balance-Leahy Scale: Good     Standing balance support: Bilateral upper extremity supported Standing balance-Leahy Scale: Poor                               Pertinent Vitals/Pain Pain Assessment Pain Assessment: No/denies pain    Home Living Family/patient expects to be discharged to:: Other (Comment) (IND living) Living Arrangements: Alone   Type of Home: Independent living facility Home Access: Level entry       Home Layout: One level   Additional Comments: 3 wheeled walker    Prior Function Prior Level of Function : Independent/Modified Independent             Mobility Comments: She holds onto furniture inside the home and uses a cane or RW when outside the home. ADLs Comments: Modified independent to independent with ADLs, cooking,  and cleaning. She also drives. Her family does her laundry.     Extremity/Trunk Assessment   Upper Extremity Assessment Upper Extremity Assessment: Overall WFL for tasks assessed    Lower Extremity Assessment Lower Extremity Assessment: RLE deficits/detail RLE Deficits / Details: AROM WFL LLE Deficits / Details: AROM WFL    Cervical / Trunk Assessment Cervical / Trunk Assessment: Kyphotic  Communication    Communication Communication: No apparent difficulties    Cognition Arousal: Alert Behavior During Therapy: WFL for tasks assessed/performed                             Following commands: Intact       Cueing Cueing Techniques: Verbal cues     General Comments      Exercises     Assessment/Plan    PT Assessment Patient needs continued PT services  PT Problem List Decreased strength;Decreased activity tolerance;Decreased mobility;Decreased balance;Decreased knowledge of use of DME;Pain       PT Treatment Interventions Gait training;Functional mobility training;Therapeutic activities;Patient/family education    PT Goals (Current goals can be found in the Care Plan section)  Acute Rehab PT Goals Patient Stated Goal: Regain IND PT Goal Formulation: With patient Time For Goal Achievement: 07/25/24 Potential to Achieve Goals: Good    Frequency Min 2X/week     Co-evaluation PT/OT/SLP Co-Evaluation/Treatment: Yes Reason for Co-Treatment: For patient/therapist safety;To address functional/ADL transfers PT goals addressed during session: Mobility/safety with mobility OT goals addressed during session: ADL's and self-care;Proper use of Adaptive equipment and DME       AM-PAC PT 6 Clicks Mobility  Outcome Measure Help needed turning from your back to your side while in a flat bed without using bedrails?: A Little Help needed moving from lying on your back to sitting on the side of a flat bed without using bedrails?: A Little Help needed moving to and from a bed to a chair (including a wheelchair)?: A Little Help needed standing up from a chair using your arms (e.g., wheelchair or bedside chair)?: A Little Help needed to walk in hospital room?: A Little Help needed climbing 3-5 steps with a railing? : A Lot 6 Click Score: 17    End of Session Equipment Utilized During Treatment: Gait belt Activity Tolerance: Patient tolerated treatment well Patient  left: in bed;with call bell/phone within reach;with family/visitor present Nurse Communication: Mobility status PT Visit Diagnosis: Unsteadiness on feet (R26.81);Muscle weakness (generalized) (M62.81)    Time: 8894-8861 PT Time Calculation (min) (ACUTE ONLY): 33 min   Charges:   PT Evaluation $PT Eval Low Complexity: 1 Low   PT General Charges $$ ACUTE PT VISIT: 1 Visit         [facility] PT Acute Rehabilitation Services Office [phone]   Shameek Nyquist 07/18/2024, 1:39 PM

## 2024-07-19 ENCOUNTER — Telehealth: Payer: Self-pay

## 2024-07-19 NOTE — ED Notes (Signed)
 07/19/2024 6:30am left a HIPAA compliant message to return call in attempt to advise patient to return to ED due to positive blood culture result.

## 2024-07-19 NOTE — Transitions of Care (Post Inpatient/ED Visit) (Unsigned)
" ° °  07/19/2024  Name: Heather Rhodes MRN: [id number] DOB: 06-20-1930  Today's TOC FU Call Status: Today's TOC FU Call Status:: Unsuccessful Call (1st Attempt) Unsuccessful Call (1st Attempt) Date: 07/19/24  Attempted to reach the patient regarding the most recent Inpatient/ED visit.  Follow Up Plan: Additional outreach attempts will be made to reach the patient to complete the Transitions of Care (Post Inpatient/ED visit) call.   Signature  Charmaine Bloodgood, LPN [facility] Health Advisor [place] [facility] You Are. We Are. [facility] Direct Dial [phone]  "

## 2024-07-19 NOTE — Telephone Encounter (Unsigned)
 Copied from CRM [id number]. Topic: General - Other >> Jul 19, 2024 12:00 PM Robinson H wrote: Reason for CRM: Patient returning call to [facility], reached out to number in notes no answer.  Renae [phone]

## 2024-07-20 LAB — CULTURE, BLOOD (SINGLE)

## 2024-07-20 LAB — URINE CULTURE: Culture: >=100000 — AB

## 2024-07-20 NOTE — Transitions of Care (Post Inpatient/ED Visit) (Signed)
" ° °  07/20/2024  Name: Heather Rhodes MRN: [id number] DOB: [birth year]  Today's TOC FU Call Status: Today's TOC FU Call Status:: Successful TOC FU Call Completed Unsuccessful Call (1st Attempt) Date: 07/19/24 Unsuccessful Call (2nd Attempt) Date: 07/20/24 [facility] FU Call Complete Date: 07/20/24  Patient's Name and Date of Birth confirmed. Name, DOB  Transition Care Management Follow-up Telephone Call Date of Discharge: 07/18/24 Discharge Facility: [facility]) Type of Discharge: Inpatient Admission Primary Inpatient Discharge Diagnosis:: COVID How have you been since you were released from the hospital?: Better Any questions or concerns?: No  Items Reviewed: Did you receive and understand the discharge instructions provided?: Yes Medications obtained,verified, and reconciled?: Yes (Medications Reviewed) Any new allergies since your discharge?: No Dietary orders reviewed?: Yes Do you have support at home?: No  Medications Reviewed Today: Medications Reviewed Today     Reviewed by Emmitt Pan, LPN (Licensed Practical Nurse) on 07/20/24 at 1607  Med List Status: <None>   Medication Order Taking? Sig Documenting Provider Last Dose Status Informant  ascorbic acid (VITAMIN C) 500 MG tablet [id number] Yes Take 500 mg by mouth daily. [provider]  Active Self  cephALEXin  (KEFLEX ) 250 MG capsule [id number] Yes Take 1 capsule (250 mg total) by mouth 3 (three) times daily for 5 days. Darci Pore, MD  Active   diclofenac  (VOLTAREN ) 75 MG EC tablet [id number] Yes TAKE 1 TABLET BY MOUTH 2 TIMES DAILY AS NEEDED. Ozell Heron HERO, MD  Active Self  famotidine  (PEPCID ) 20 MG tablet [id number] Yes Take 1 tablet (20 mg total) by mouth 2 (two) times daily.  Patient taking differently: Take 20 mg by mouth 2 (two) times daily as needed.   Ozell Heron HERO, MD  Active Self  Multiple Vitamin (MULTIVITAMIN PO) [id number] Yes Take 1 tablet by mouth daily. [provider]   Active Self  Multiple Vitamins-Minerals (ZINC PO) [id number] Yes Take 1 tablet by mouth daily. 120mg  calcium daily/15mg  zinc daily [provider]  Active Self  OVER THE COUNTER MEDICATION [id number] Yes Take 1 tablet by mouth daily. Magnesium 200mg -potassium 99mcg daily [provider]  Active Self  Polyethyl Glycol-Propyl Glycol (SYSTANE OP) [id number] Yes Place 1 drop into both eyes 3 (three) times daily. [provider]  Active Self  VITAMIN D  PO [id number] Yes Take 50 mcg by mouth daily. [provider]  Active Self            Home Care and Equipment/Supplies: Were Home Health Services Ordered?: NA Any new equipment or medical supplies ordered?: NA  Functional Questionnaire: Do you need assistance with bathing/showering or dressing?: No Do you need assistance with meal preparation?: No Do you need assistance with eating?: No Do you have difficulty maintaining continence: No Do you need assistance with getting out of bed/getting out of a chair/moving?: No Do you have difficulty managing or taking your medications?: No  Follow up appointments reviewed: PCP Follow-up appointment confirmed?: Yes Date of PCP follow-up appointment?: 07/22/24 Follow-up Provider: [facility] Follow-up appointment confirmed?: NA Do you need transportation to your follow-up appointment?: No Do you understand care options if your condition(s) worsen?: Yes-patient verbalized understanding    SIGNATURE Pan Emmitt, LPN [facility] Nurse Health Advisor Direct Dial [phone]  "

## 2024-07-20 NOTE — Transitions of Care (Post Inpatient/ED Visit) (Signed)
" ° °  07/20/2024  Name: Heather Rhodes MRN: [id number] DOB: [birth year]  Today's TOC FU Call Status: Today's TOC FU Call Status:: Unsuccessful Call (2nd Attempt) Unsuccessful Call (1st Attempt) Date: 07/19/24 Unsuccessful Call (2nd Attempt) Date: 07/20/24  Attempted to reach the patient regarding the most recent Inpatient/ED visit.  Follow Up Plan: Additional outreach attempts will be made to reach the patient to complete the Transitions of Care (Post Inpatient/ED visit) call.   Signature Julian Lemmings, LPN [facility] Nurse Health Advisor Direct Dial [phone]  "

## 2024-07-21 ENCOUNTER — Telehealth (HOSPITAL_BASED_OUTPATIENT_CLINIC_OR_DEPARTMENT_OTHER): Payer: Self-pay | Admitting: *Deleted

## 2024-07-21 NOTE — Progress Notes (Signed)
 ED Antimicrobial Stewardship Positive Culture Follow Up   Heather Rhodes is an 89 y.o. female who presented to [facility] on [facility]24 with a chief complaint of  Chief Complaint  Patient presents with   Extremity Weakness    legs    Recent Results (from the past 720 hours)  Urine Culture (for pregnant, neutropenic or urologic patients or patients with an indwelling urinary catheter)     Status: Abnormal   Collection Time: 07/17/24 12:11 AM   Specimen: Urine, Clean Catch  Result Value Ref Range Status   Specimen Description   Final    URINE, CLEAN CATCH Performed at [facility], [address], [place], [place] [address]    Special Requests   Final    NONE Performed at [facility], [address], [place], [place] [address]    Culture >=100,000 COLONIES/mL ESCHERICHIA COLI (A)  Final   Report Status 07/20/2024 FINAL  Final   Organism ID, Bacteria ESCHERICHIA COLI (A)  Final      Susceptibility   Escherichia coli - MIC*    AMPICILLIN >=32 RESISTANT Resistant     CEFAZOLIN  (URINE) Value in next row Resistant      >=32 RESISTANTThis is a modified FDA-approved test that has been validated and its performance characteristics determined by the reporting laboratory.  This laboratory is certified under the Clinical Laboratory Improvement Amendments CLIA as qualified to perform high complexity clinical laboratory testing.    CEFEPIME Value in next row Sensitive      >=32 RESISTANTThis is a modified FDA-approved test that has been validated and its performance characteristics determined by the reporting laboratory.  This laboratory is certified under the Clinical Laboratory Improvement Amendments CLIA as qualified to perform high complexity clinical laboratory testing.    ERTAPENEM Value in next row Sensitive      >=32 RESISTANTThis is a modified FDA-approved test that has been validated and its performance characteristics determined by the reporting  laboratory.  This laboratory is certified under the Clinical Laboratory Improvement Amendments CLIA as qualified to perform high complexity clinical laboratory testing.    CEFTRIAXONE  Value in next row Sensitive      >=32 RESISTANTThis is a modified FDA-approved test that has been validated and its performance characteristics determined by the reporting laboratory.  This laboratory is certified under the Clinical Laboratory Improvement Amendments CLIA as qualified to perform high complexity clinical laboratory testing.    CIPROFLOXACIN Value in next row Sensitive      >=32 RESISTANTThis is a modified FDA-approved test that has been validated and its performance characteristics determined by the reporting laboratory.  This laboratory is certified under the Clinical Laboratory Improvement Amendments CLIA as qualified to perform high complexity clinical laboratory testing.    GENTAMICIN Value in next row Sensitive      >=32 RESISTANTThis is a modified FDA-approved test that has been validated and its performance characteristics determined by the reporting laboratory.  This laboratory is certified under the Clinical Laboratory Improvement Amendments CLIA as qualified to perform high complexity clinical laboratory testing.    NITROFURANTOIN Value in next row Sensitive      >=32 RESISTANTThis is a modified FDA-approved test that has been validated and its performance characteristics determined by the reporting laboratory.  This laboratory is certified under the Clinical Laboratory Improvement Amendments CLIA as qualified to perform high complexity clinical laboratory testing.    TRIMETH /SULFA  Value in next row Sensitive      >=32 RESISTANTThis is a  modified FDA-approved test that has been validated and its performance characteristics determined by the reporting laboratory.  This laboratory is certified under the Clinical Laboratory Improvement Amendments CLIA as qualified to perform high complexity clinical  laboratory testing.    AMPICILLIN/SULBACTAM Value in next row Resistant      >=32 RESISTANTThis is a modified FDA-approved test that has been validated and its performance characteristics determined by the reporting laboratory.  This laboratory is certified under the Clinical Laboratory Improvement Amendments CLIA as qualified to perform high complexity clinical laboratory testing.    PIP/TAZO Value in next row Sensitive      8 SENSITIVEThis is a modified FDA-approved test that has been validated and its performance characteristics determined by the reporting laboratory.  This laboratory is certified under the Clinical Laboratory Improvement Amendments CLIA as qualified to perform high complexity clinical laboratory testing.    MEROPENEM Value in next row Sensitive      8 SENSITIVEThis is a modified FDA-approved test that has been validated and its performance characteristics determined by the reporting laboratory.  This laboratory is certified under the Clinical Laboratory Improvement Amendments CLIA as qualified to perform high complexity clinical laboratory testing.    * >=100,000 COLONIES/mL ESCHERICHIA COLI  Resp panel by RT-PCR (RSV, Flu A&B, Covid) Anterior Nasal Swab     Status: Abnormal   Collection Time: 07/17/24  9:26 PM   Specimen: Anterior Nasal Swab  Result Value Ref Range Status   SARS Coronavirus 2 by RT PCR POSITIVE (A) NEGATIVE Final    Comment: (NOTE) SARS-CoV-2 target nucleic acids are DETECTED.  The SARS-CoV-2 RNA is generally detectable in upper respiratory specimens during the acute phase of infection. Positive results are indicative of the presence of the identified virus, but do not rule out bacterial infection or co-infection with other pathogens not detected by the test. Clinical correlation with patient history and other diagnostic information is necessary to determine patient infection status. The expected result is Negative.  Fact Sheet for  Patients: bloggercourse.com  Fact Sheet for Healthcare Providers: seriousbroker.it  This test is not yet approved or cleared by the [place]  FDA and  has been authorized for detection and/or diagnosis of SARS-CoV-2 by FDA under an Emergency Use Authorization (EUA).  This EUA will remain in effect (meaning this test can be used) for the duration of  the COVID-19 declaration under Section 564(b)(1) of the A ct, 21 U.S.C. section 360bbb-3(b)(1), unless the authorization is terminated or revoked sooner.     Influenza A by PCR NEGATIVE NEGATIVE Final   Influenza B by PCR NEGATIVE NEGATIVE Final    Comment: (NOTE) The Xpert Xpress SARS-CoV-2/FLU/RSV plus assay is intended as an aid in the diagnosis of influenza from Nasopharyngeal swab specimens and should not be used as a sole basis for treatment. Nasal washings and aspirates are unacceptable for Xpert Xpress SARS-CoV-2/FLU/RSV testing.  Fact Sheet for Patients: bloggercourse.com  Fact Sheet for Healthcare Providers: seriousbroker.it  This test is not yet approved or cleared by the [place]  FDA and has been authorized for detection and/or diagnosis of SARS-CoV-2 by FDA under an Emergency Use Authorization (EUA). This EUA will remain in effect (meaning this test can be used) for the duration of the COVID-19 declaration under Section 564(b)(1) of the Act, 21 U.S.C. section 360bbb-3(b)(1), unless the authorization is terminated or revoked.     Resp Syncytial Virus by PCR NEGATIVE NEGATIVE Final    Comment: (NOTE) Fact Sheet for Patients: bloggercourse.com  Fact Sheet for Healthcare  Providers: seriousbroker.it  This test is not yet approved or cleared by the [place]  FDA and has been authorized for detection and/or diagnosis of SARS-CoV-2 by FDA under an Emergency Use  Authorization (EUA). This EUA will remain in effect (meaning this test can be used) for the duration of the COVID-19 declaration under Section 564(b)(1) of the Act, 21 U.S.C. section 360bbb-3(b)(1), unless the authorization is terminated or revoked.  Performed at [facility], [address], [place], [place] [address]   Culture, blood (single)     Status: Abnormal   Collection Time: 07/17/24 11:55 PM   Specimen: BLOOD RIGHT ARM  Result Value Ref Range Status   Specimen Description   Final    BLOOD RIGHT ARM Performed at [facility], [address], [place], [place] [address]    Special Requests   Final    BOTTLES DRAWN AEROBIC AND ANAEROBIC Blood Culture results may not be optimal due to an inadequate volume of blood received in culture bottles Performed at [facility], [address] [place], [place] [address]    Culture  Setup Time   Final    GRAM POSITIVE COCCI AEROBIC BOTTLE ONLY CRITICAL RESULT CALLED TO, READ BACK BY AND VERIFIED WITH:  HOLLY CHARGE RN  07/18/2024 BY DD @ 2352    Culture (A)  Final    STAPHYLOCOCCUS HAEMOLYTICUS THE SIGNIFICANCE OF ISOLATING THIS ORGANISM FROM A SINGLE VENIPUNCTURE CANNOT BE PREDICTED WITHOUT FURTHER CLINICAL AND CULTURE CORRELATION. SUSCEPTIBILITIES AVAILABLE ONLY ON REQUEST. Performed at [facility], [address], [place], [place] [address]    Report Status 07/20/2024 FINAL  Final  Blood Culture ID Panel (Reflexed)     Status: Abnormal   Collection Time: 07/17/24 11:55 PM  Result Value Ref Range Status   Enterococcus faecalis NOT DETECTED NOT DETECTED Final   Enterococcus Faecium NOT DETECTED NOT DETECTED Final   Listeria monocytogenes NOT DETECTED NOT DETECTED Final   Staphylococcus species DETECTED (A) NOT DETECTED Final    Comment: CRITICAL RESULT CALLED TO, READ BACK BY AND VERIFIED WITH:  HOLLY CHARGE RN  07/18/2024 BY DD @ 2352    Staphylococcus aureus (BCID) NOT DETECTED  NOT DETECTED Final   Staphylococcus epidermidis NOT DETECTED NOT DETECTED Final   Staphylococcus lugdunensis NOT DETECTED NOT DETECTED Final   Streptococcus species NOT DETECTED NOT DETECTED Final   Streptococcus agalactiae NOT DETECTED NOT DETECTED Final   Streptococcus pneumoniae NOT DETECTED NOT DETECTED Final   Streptococcus pyogenes NOT DETECTED NOT DETECTED Final   A.calcoaceticus-baumannii NOT DETECTED NOT DETECTED Final   Bacteroides fragilis NOT DETECTED NOT DETECTED Final   Enterobacterales NOT DETECTED NOT DETECTED Final   Enterobacter cloacae complex NOT DETECTED NOT DETECTED Final   Escherichia coli NOT DETECTED NOT DETECTED Final   Klebsiella aerogenes NOT DETECTED NOT DETECTED Final   Klebsiella oxytoca NOT DETECTED NOT DETECTED Final   Klebsiella pneumoniae NOT DETECTED NOT DETECTED Final   Proteus species NOT DETECTED NOT DETECTED Final   Salmonella species NOT DETECTED NOT DETECTED Final   Serratia marcescens NOT DETECTED NOT DETECTED Final   Haemophilus influenzae NOT DETECTED NOT DETECTED Final   Neisseria meningitidis NOT DETECTED NOT DETECTED Final   Pseudomonas aeruginosa NOT DETECTED NOT DETECTED Final   Stenotrophomonas maltophilia NOT DETECTED NOT DETECTED Final   Candida albicans NOT DETECTED NOT DETECTED Final   Candida auris NOT DETECTED NOT DETECTED Final   Candida glabrata NOT DETECTED NOT DETECTED Final   Candida  krusei NOT DETECTED NOT DETECTED Final   Candida parapsilosis NOT DETECTED NOT DETECTED Final   Candida tropicalis NOT DETECTED NOT DETECTED Final   Cryptococcus neoformans/gattii NOT DETECTED NOT DETECTED Final    Comment: Performed at [facility], [address] [place], [place] [address]    [x]  Treated with cephalexin , organism resistant to prescribed antimicrobial.  37 YOF who presented with weakness and found to be COVID+. No urinary symptoms were noted and UA showing only 0-5 WBC however culture still sent which grew E.coli  resistant to the cephalexin  the patient was sent at discharge. Of noting in a TOC at discharge follow-up call on 1/20 the patient reported feeling better. E.coli in the urine is likely representative of colonization and not pathogenic at this time.   Also of noting the patient had 1 of 2 bottles growing staph haemolyticus - likely representative of contamination. No treatment needed for this at this time.   Call the patient to STOP cephalexin , no additional antibiotics are needed at this time.   ED Provider: Almarie Knee, PA  Thank you for allowing pharmacy to be a part of this patients care.  Almarie Lunger, PharmD, BCPS, BCIDP Infectious Diseases Clinical Pharmacist 07/21/2024 8:42 AM   **Pharmacist phone directory can now be found on [email or web address] ([email or web address]).  Listed under [facility].

## 2024-07-21 NOTE — Telephone Encounter (Signed)
 Post ED Visit - Positive Culture Follow-up: Successful Patient Follow-Up  Culture assessed and recommendations reviewed by:  []  Rankin Dee, Pharm.D. []  Venetia Gully, Pharm.D., BCPS AQ-ID []  Garrel Crews, Pharm.D., BCPS [x]  Almarie Lunger, [address]D., BCPS []  [place], [address]D., BCPS, AAHIVP []  Rosaline Bihari, Pharm.D., BCPS, AAHIVP []  Vernell Meier, PharmD, BCPS []  Latanya Hint, PharmD, BCPS []  Donald Medley, PharmD, BCPS []  Rocky Bold, PharmD  Positive urine culture  Changes discussed with ED provider: Almarie Knee, PA New antibiotic prescription none Contact [facility] checks on patient daily and she will make her aware its ok to stop antibiotics and no additional treatment needed.   Contacted patient emergency contact Ronal , date 07/21/24, time 0948   Heather Rhodes 07/21/2024, 9:55 AM

## 2024-07-22 ENCOUNTER — Telehealth: Payer: Self-pay

## 2024-07-22 ENCOUNTER — Telehealth: Admitting: Family Medicine

## 2024-07-22 ENCOUNTER — Encounter: Payer: Self-pay | Admitting: Family Medicine

## 2024-07-22 VITALS — Temp 98.3°F

## 2024-07-22 DIAGNOSIS — N3000 Acute cystitis without hematuria: Secondary | ICD-10-CM

## 2024-07-22 MED ORDER — SULFAMETHOXAZOLE-TRIMETHOPRIM 800-160 MG PO TABS: 1.0000 | ORAL_TABLET | Freq: Two times a day (BID) | ORAL | 0 refills | Status: AC

## 2024-07-22 NOTE — Telephone Encounter (Signed)
 Copied from CRM #[id number]. Topic: Clinical - Medical Advice >> Jul 22, 2024  1:22 PM Brittany M wrote: Reason for CRM: Patient forgot to ask- Does she still need to quarantine herself? Please reach out to patient as soon as possible.

## 2024-07-22 NOTE — Progress Notes (Unsigned)
 "  Virtual Medical Office Visit  Patient:  ADDALIE CALLES      Age: 89 y.o.       Sex:  female  Date:   07/23/2024  PCP:    Ozell Heron HERO, MD   Today's Healthcare Provider: Heron HERO Ozell, MD    Assessment/Plan:   Summary assessment:  Darya was seen today for hospitalization follow-up.  Acute cystitis without hematuria -     Sulfamethoxazole -Trimethoprim ; Take 1 tablet by mouth 2 (two) times daily for 5 days.  Dispense: 10 tablet; Refill: 0   Assessment and Plan    Acute cystitis E. coli identified in urine culture. Previous antibiotic, cephalexin , was ineffective due to resistance. Asymptomatic but concern for untreated infection contributing to weakness. No urinary symptoms reported. Dehydration noted during ER visit, but kidney function is good. - Prescribed Bactrim , one tablet twice a day for five days. - [place] [address] - Advised to stay indoors and monitor for symptom recurrence.        No follow-ups on file.   She was advised to call the office or go to ER if her condition worsens    Subjective:   BRITTIANY WIEHE is a 89 y.o. female with PMH significant for: Past Medical History:  Diagnosis Date   Arthritis    Breast cancer (HCC)    2004-left breast per patient   Cancer [facility])    breast left and endometrial cancer   Complication of anesthesia    during hysterectomy 2012 had to wake up took 3 hours and BP was low   Personal history of chemotherapy    with endometrial cancer   Pneumonia    Squamous cell carcinoma of skin    right hand scc per patient   Urinary incontinence    UTI (urinary tract infection)      Presenting today with: Chief Complaint  Patient presents with   Hospitalization Follow-up     She clarifies and reports that her condition: Discussed the use of AI scribe software for clinical note transcription with the patient, who gave verbal consent to proceed.  History of Present Illness   ZARETH RIPPETOE is  a 89 year old female who presents with progressive weakness and a urinary tract infection.  She has had progressive weakness over an unspecified period without fever or chest pain.  During a recent evaluation, she was diagnosed with COVID-19 but has remained asymptomatic.  At the same visit she was found to have an E. coli urinary tract infection. She was started on cephalexin , but the organism was later found to be resistant. She has not had dysuria or other urinary symptoms.  Blood cultures from that evaluation were contaminants.  She was mildly dehydrated in the emergency room, but her kidney function was normal.       She denies having any: Fever or chills          Objective/Observations  Physical Exam:  Polite and friendly Gen: NAD, resting comfortably Pulm: Normal work of breathing Neuro: Grossly normal, moves all extremities Psych: Normal affect and thought content Problem specific physical exam findings:  N/A  No images are attached to the encounter or orders placed in the encounter.    Results: No results found for any visits on 07/22/24.         Virtual Visit via Video   I connected with NISHI NEISWONGER on 07/23/24 at  1:00 PM EST by a video enabled telemedicine application and  verified that I am speaking with the correct person using two identifiers. The limitations of evaluation and management by telemedicine and the availability of in person appointments were discussed. The patient expressed understanding and agreed to proceed.   Percentage of appointment time on video:  100% Patient location: Home Provider location: [place] [facility] Persons participating in the virtual visit: Myself and Patient     "

## 2024-07-23 NOTE — Telephone Encounter (Signed)
 Patient informed of the message below.

## 2024-07-23 NOTE — Telephone Encounter (Signed)
 No she should not have to quarantine herself.

## 2025-04-18 ENCOUNTER — Ambulatory Visit
